# Patient Record
Sex: Male | Born: 1970 | Race: White | Hispanic: No | Smoking: Current every day smoker
Health system: Southern US, Community
[De-identification: ages and names within clinical notes are randomized; demographics above are authoritative.]

## PROBLEM LIST (undated history)

## (undated) DIAGNOSIS — F1910 Other psychoactive substance abuse, uncomplicated: Secondary | ICD-10-CM

## (undated) DIAGNOSIS — J45909 Unspecified asthma, uncomplicated: Secondary | ICD-10-CM

## (undated) DIAGNOSIS — F32A Depression, unspecified: Secondary | ICD-10-CM

## (undated) DIAGNOSIS — F419 Anxiety disorder, unspecified: Secondary | ICD-10-CM

## (undated) DIAGNOSIS — F329 Major depressive disorder, single episode, unspecified: Secondary | ICD-10-CM

## (undated) HISTORY — DX: Depression, unspecified: F32.A

## (undated) HISTORY — DX: Other psychoactive substance abuse, uncomplicated: F19.10

## (undated) HISTORY — DX: Anxiety disorder, unspecified: F41.9

## (undated) HISTORY — DX: Major depressive disorder, single episode, unspecified: F32.9

## (undated) HISTORY — DX: Unspecified asthma, uncomplicated: J45.909

---

## 2002-01-26 ENCOUNTER — Encounter: Payer: Self-pay | Admitting: Emergency Medicine

## 2002-01-26 ENCOUNTER — Emergency Department (HOSPITAL_COMMUNITY): Admission: EM | Admit: 2002-01-26 | Discharge: 2002-01-26 | Payer: Self-pay | Admitting: Emergency Medicine

## 2006-12-28 ENCOUNTER — Emergency Department: Payer: Self-pay

## 2017-06-17 ENCOUNTER — Ambulatory Visit: Payer: Self-pay | Admitting: Urology

## 2017-07-10 ENCOUNTER — Ambulatory Visit (INDEPENDENT_AMBULATORY_CARE_PROVIDER_SITE_OTHER): Payer: Self-pay | Admitting: Urology

## 2017-07-10 ENCOUNTER — Encounter: Payer: Self-pay | Admitting: Urology

## 2017-07-10 VITALS — BP 143/82 | HR 111 | Resp 16 | Ht 66.0 in | Wt 192.0 lb

## 2017-07-10 DIAGNOSIS — R339 Retention of urine, unspecified: Secondary | ICD-10-CM

## 2017-07-10 DIAGNOSIS — N401 Enlarged prostate with lower urinary tract symptoms: Secondary | ICD-10-CM

## 2017-07-10 LAB — MICROSCOPIC EXAMINATION
Bacteria, UA: NONE SEEN
Epithelial Cells (non renal): NONE SEEN /hpf (ref 0–10)
RBC, UA: NONE SEEN /hpf (ref 0–2)
WBC, UA: NONE SEEN /hpf (ref 0–5)

## 2017-07-10 LAB — URINALYSIS, COMPLETE
Bilirubin, UA: NEGATIVE
Glucose, UA: NEGATIVE
Ketones, UA: NEGATIVE
Leukocytes, UA: NEGATIVE
Nitrite, UA: NEGATIVE
Protein, UA: NEGATIVE
RBC, UA: NEGATIVE
Specific Gravity, UA: <1.005 — ABNORMAL LOW (ref 1.005–1.030)
Urobilinogen, Ur: 0.2 mg/dL (ref 0.2–1.0)
pH, UA: 5.5 (ref 5.0–7.5)

## 2017-07-10 LAB — BLADDER SCAN AMB NON-IMAGING: Scan Result: 0

## 2017-07-10 MED ORDER — TAMSULOSIN HCL 0.4 MG PO CAPS: 0.4000 mg | ORAL_CAPSULE | Freq: Every day | ORAL | 2 refills | Status: DC

## 2017-07-10 NOTE — Progress Notes (Signed)
07/10/2017 10:14 AM   Vergie LivingJason K Atteberry 03-Oct-1970 [id number]  Referring provider: No referring provider defined for this encounter.  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  He is on methadone and undergoes periodic urine drug testing and states he has to void in front of a witness which he is unable to do.  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  He does have baseline urinary hesitancy, decreased force and caliber of his urinary stream, straining to urinate and postvoid dribbling.  He has nocturia x1-2.  He saw Dr. Evelene CroonWolff several years ago but states nothing was recommended.  He denies dysuria or gross hematuria.  He denies flank, abdominal, pelvic or scrotal pain.   PMH: Past Medical History:  Diagnosis Date  . Anxiety   . Asthma   . Depression     Surgical History: History reviewed. No pertinent surgical history.  Home Medications:  Allergies as of 07/10/2017   No Known Allergies     Medication List        Accurate as of 07/10/17 10:14 AM. Always use your most recent med list.          diphenhydramine-acetaminophen 25-500 MG Tabs tablet Commonly known as:  TYLENOL PM Take 1 tablet by mouth at bedtime as needed.       Allergies: No Known Allergies  Family History: Family History  Problem Relation Age of Onset  . Prostate cancer Neg Hx   . Bladder Cancer Neg Hx   . Kidney cancer Neg Hx     Social History:  reports that he has been smoking.  He has never used smokeless tobacco. He reports that he has current or past drug history. His alcohol history is not on file.  ROS: UROLOGY Frequent Urination?: No Hard to postpone urination?: No Burning/pain with urination?: No Get up at night to urinate?: Yes Leakage of urine?: Yes Urine stream starts and stops?: Yes Trouble starting stream?: Yes Do  you have to strain to urinate?: Yes Blood in urine?: No Urinary tract infection?: No Sexually transmitted disease?: No Injury to kidneys or bladder?: No Painful intercourse?: No Weak stream?: No Erection problems?: Yes Penile pain?: No  Gastrointestinal Nausea?: No Vomiting?: No Indigestion/heartburn?: No Diarrhea?: No Constipation?: Yes  Constitutional Fever: No Night sweats?: No Weight loss?: No Fatigue?: Yes  Skin Skin rash/lesions?: Yes Itching?: Yes  Eyes Blurred vision?: Yes Double vision?: No  Ears/Nose/Throat Sore throat?: No Sinus problems?: No  Hematologic/Lymphatic Swollen glands?: No Easy bruising?: No  Cardiovascular Leg swelling?: No Chest pain?: No  Respiratory Cough?: Yes Shortness of breath?: Yes  Endocrine Excessive thirst?: No  Musculoskeletal Back pain?: No Joint pain?: No  Neurological Headaches?: Yes Dizziness?: No  Psychologic Depression?: Yes Anxiety?: Yes  Physical Exam: BP (!) 143/82   Pulse (!) 111   Resp 16   Ht 5\' 6"  (1.676 m)   Wt 192 lb (87.1 kg)   SpO2 98%   BMI 30.99 kg/m   Constitutional:  Alert and oriented, No acute distress. HEENT: [place] AT, moist mucus membranes.  Trachea midline, no masses. Cardiovascular: No clubbing, cyanosis, or edema. Respiratory: Normal respiratory effort, no increased work of breathing. GI: Abdomen is soft, nontender, nondistended, no abdominal masses GU: No CVA tenderness.  Prostate 30 g smooth without nodules Lymph: No cervical or  inguinal lymphadenopathy. Skin: No rashes, bruises or suspicious lesions. Neurologic: Grossly intact, no focal deficits, moving all 4 extremities. Psychiatric: Normal mood and affect.   Assessment & Plan:   47 year old male with trouble voiding in front of others.  He does have moderate baseline lower urinary tract symptoms.  PVR by bladder scan today was 0 mL.  He was informed that the diagnosis of shy bladder is a diagnosis of exclusion.  I  recommended scheduling a renal ultrasound.  His lower urinary tract symptoms are bothersome and he did desire a trial of medical management.  Rx tamsulosin was sent to his pharmacy.  He states he has seen a therapist.  If his evaluation is negative I informed him that all I would be able to say is that I see no organic cause of his symptoms and he may have shy bladder.  He is seeing a therapist and would also need input from his therapist regarding this diagnosis.    Riki Altes, MD  [facility] [address] [place], [place] [address] [phone]

## 2017-08-07 ENCOUNTER — Ambulatory Visit: Payer: Self-pay

## 2017-08-22 ENCOUNTER — Ambulatory Visit: Payer: Self-pay

## 2019-08-14 ENCOUNTER — Other Ambulatory Visit: Payer: Self-pay

## 2019-08-14 ENCOUNTER — Emergency Department
Admission: EM | Admit: 2019-08-14 | Discharge: 2019-08-15 | Disposition: A | Discharge status: Home / Self Care | Payer: Self-pay | Attending: Emergency Medicine | Admitting: Emergency Medicine

## 2019-08-14 DIAGNOSIS — R440 Auditory hallucinations: Secondary | ICD-10-CM | POA: Insufficient documentation

## 2019-08-14 DIAGNOSIS — J45909 Unspecified asthma, uncomplicated: Secondary | ICD-10-CM | POA: Insufficient documentation

## 2019-08-14 DIAGNOSIS — F419 Anxiety disorder, unspecified: Secondary | ICD-10-CM | POA: Insufficient documentation

## 2019-08-14 DIAGNOSIS — Z046 Encounter for general psychiatric examination, requested by authority: Secondary | ICD-10-CM | POA: Insufficient documentation

## 2019-08-14 DIAGNOSIS — F17210 Nicotine dependence, cigarettes, uncomplicated: Secondary | ICD-10-CM | POA: Insufficient documentation

## 2019-08-14 DIAGNOSIS — F329 Major depressive disorder, single episode, unspecified: Secondary | ICD-10-CM | POA: Insufficient documentation

## 2019-08-14 DIAGNOSIS — Z79899 Other long term (current) drug therapy: Secondary | ICD-10-CM | POA: Insufficient documentation

## 2019-08-14 DIAGNOSIS — R456 Violent behavior: Secondary | ICD-10-CM | POA: Insufficient documentation

## 2019-08-14 DIAGNOSIS — F1990 Other psychoactive substance use, unspecified, uncomplicated: Secondary | ICD-10-CM | POA: Insufficient documentation

## 2019-08-14 DIAGNOSIS — Z20822 Contact with and (suspected) exposure to covid-19: Secondary | ICD-10-CM | POA: Insufficient documentation

## 2019-08-14 LAB — COMPREHENSIVE METABOLIC PANEL
ALT: 16 U/L (ref 0–44)
AST: 15 U/L (ref 15–41)
Albumin: 4.8 g/dL (ref 3.5–5.0)
Alkaline Phosphatase: 85 U/L (ref 38–126)
Anion gap: 8 (ref 5–15)
BUN: 25 mg/dL — ABNORMAL HIGH (ref 6–20)
CO2: 28 mmol/L (ref 22–32)
Calcium: 9.2 mg/dL (ref 8.9–10.3)
Chloride: 101 mmol/L (ref 98–111)
Creatinine, Ser: 0.9 mg/dL (ref 0.61–1.24)
GFR calc Af Amer: >60 mL/min (ref >60)
GFR calc non Af Amer: >60 mL/min (ref >60)
Glucose, Bld: 101 mg/dL — ABNORMAL HIGH (ref 70–99)
Potassium: 4.1 mmol/L (ref 3.5–5.1)
Sodium: 137 mmol/L (ref 135–145)
Total Bilirubin: 0.6 mg/dL (ref 0.3–1.2)
Total Protein: 7.8 g/dL (ref 6.5–8.1)

## 2019-08-14 LAB — URINE DRUG SCREEN, QUALITATIVE (ARMC ONLY)
Amphetamines, Ur Screen: POSITIVE — AB
Barbiturates, Ur Screen: NOT DETECTED
Benzodiazepine, Ur Scrn: NOT DETECTED
Cannabinoid 50 Ng, Ur ~~LOC~~: NOT DETECTED
Cocaine Metabolite,Ur ~~LOC~~: NOT DETECTED
MDMA (Ecstasy)Ur Screen: NOT DETECTED
Methadone Scn, Ur: POSITIVE — AB
Opiate, Ur Screen: NOT DETECTED
Phencyclidine (PCP) Ur S: NOT DETECTED
Tricyclic, Ur Screen: NOT DETECTED

## 2019-08-14 LAB — CBC
HCT: 38.6 % — ABNORMAL LOW (ref 39.0–52.0)
Hemoglobin: 13.4 g/dL (ref 13.0–17.0)
MCH: 33.1 pg (ref 26.0–34.0)
MCHC: 34.7 g/dL (ref 30.0–36.0)
MCV: 95.3 fL (ref 80.0–100.0)
Platelets: 238 10*3/uL (ref 150–400)
RBC: 4.05 MIL/uL — ABNORMAL LOW (ref 4.22–5.81)
RDW: 12.1 % (ref 11.5–15.5)
WBC: 7.3 10*3/uL (ref 4.0–10.5)
nRBC: 0 % (ref 0.0–0.2)

## 2019-08-14 LAB — ETHANOL: Alcohol, Ethyl (B): <10 mg/dL (ref <10)

## 2019-08-14 LAB — ACETAMINOPHEN LEVEL: Acetaminophen (Tylenol), Serum: <10 ug/mL — ABNORMAL LOW (ref 10–30)

## 2019-08-14 LAB — SALICYLATE LEVEL: Salicylate Lvl: <7 mg/dL — ABNORMAL LOW (ref 7.0–30.0)

## 2019-08-14 NOTE — ED Notes (Addendum)
Pt was given a sandwich tray and a cup of sprite with ice, no straw or lid. Pt ate 100%.

## 2019-08-14 NOTE — ED Notes (Signed)
Patient changed into hospital provided scrubs by this RN and by [facility] NT. Patient's belonging's placed into labeled bag. Patient's belonging's include:  Grey wallet, red polo shirt, blue jeans, dark blue/black belt, red sneakers, red socks, black boxers, yellow-colored metal ring.
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
## 2019-08-14 NOTE — ED Notes (Signed)
Pt talking to TTS att
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
## 2019-08-15 DIAGNOSIS — F1990 Other psychoactive substance use, unspecified, uncomplicated: Secondary | ICD-10-CM

## 2019-08-15 LAB — SARS CORONAVIRUS 2 BY RT PCR (HOSPITAL ORDER, PERFORMED IN ~~LOC~~ HOSPITAL LAB): SARS Coronavirus 2: NEGATIVE
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
## 2019-08-15 NOTE — BH Assessment (Signed)
Writer spoke with the patient to complete an updated/reassessment. Patient denies SI/HI and AV/H.  Spoke with patient's girlfriend (Stacey-[phone]), she reports of having no concerns of him harming himself or anyone else.  Patient does not meet inpatient criteria.

## 2019-08-15 NOTE — ED Notes (Signed)
Pt brought into ED BHU via sally port and wand with metal detector for safety by [place] Security officer. Patient oriented to unit/care area: Pt informed of unit policies and procedures.  Informed that, for their safety, care areas are designed for safety and monitored by security cameras at all times. Patient verbalizes understanding, and verbal contract for safety obtained.Pt shown to their room.  

## 2019-08-15 NOTE — ED Notes (Signed)
Given phone to call family 

## 2019-08-15 NOTE — ED Provider Notes (Signed)
-----------------------------------------   12:18 PM on 08/15/2019 -----------------------------------------  Patient has been seen and evaluated by psychiatry.  They have rescinded the patient's IVC and believe the patient is safe for discharge home from a psychiatric standpoint.  Patient's medical work-up is largely nonrevealing besides positive amphetamine and methadone urine drug screen.  Patient will be discharged home.   Minna Antis, MD 08/15/19 1218

## 2019-08-15 NOTE — Discharge Instructions (Signed)
You have been seen in the emergency department for a  psychiatric concern. You have been evaluated both medically as well as psychiatrically. Please follow-up with your outpatient resources provided. Return to the emergency department for any worsening symptoms, or any thoughts of hurting yourself or anyone else so that we may attempt to help you. 

## 2019-08-15 NOTE — ED Notes (Signed)
Pt to toilet.  

## 2019-08-15 NOTE — ED Notes (Signed)
Pt signed dc paperwork and paperwork sent to records. 

## 2019-10-26 ENCOUNTER — Ambulatory Visit: Payer: Self-pay | Admitting: Physician Assistant

## 2019-10-26 ENCOUNTER — Other Ambulatory Visit: Payer: Self-pay

## 2019-10-26 DIAGNOSIS — A5401 Gonococcal cystitis and urethritis, unspecified: Secondary | ICD-10-CM

## 2019-10-26 DIAGNOSIS — Z113 Encounter for screening for infections with a predominantly sexual mode of transmission: Secondary | ICD-10-CM

## 2019-10-26 MED ORDER — DOXYCYCLINE HYCLATE 100 MG PO TABS: 100.0000 mg | ORAL_TABLET | Freq: Two times a day (BID) | ORAL | 0 refills | Status: DC

## 2019-10-26 MED ORDER — CEFTRIAXONE SODIUM 500 MG IJ SOLR
500.0000 mg | Freq: Once | INTRAMUSCULAR | Status: AC
  Administered 2019-10-26: 500 mg via INTRAMUSCULAR

## 2019-10-26 NOTE — Progress Notes (Signed)
Gram stain reviewed, pt treated for GC per provider orders. Provider orders completed. 

## 2019-10-27 LAB — GRAM STAIN

## 2019-10-28 ENCOUNTER — Encounter: Payer: Self-pay | Admitting: Physician Assistant

## 2019-10-28 NOTE — Progress Notes (Signed)
[facility] Department STI clinic/screening visit  Subjective:  [name] is a 49 y.o. male being seen today for an STI screening visit. The patient reports they do have symptoms.    Patient has the following medical conditions:  There are no problems to display for this patient.    Chief Complaint  Patient presents with  . SEXUALLY TRANSMITTED DISEASE    STD screening including bloodwork    HPI  Patient reports that he has had yellow discharge and dysuria for about 6 days.  Denies other symptoms and would like a screening today.  Reports that his last HIV and Hep C test was done at his methadone clinic about 2 months ago.  Patient reports that he is in counseling for anxiety and depression and that he is taking methadone daily.  Last void prior to collecting sample for Gram stain was about 45 minutes ago.     See flowsheet for further details and programmatic requirements.    The following portions of the patient's history were reviewed and updated as appropriate: allergies, current medications, past medical history, past social history, past surgical history and problem list.  Objective:  There were no vitals filed for this visit.  Physical Exam Constitutional:      General: He is not in acute distress. HENT:     Head: Normocephalic and atraumatic.     Comments: No nits, lice or hair loss. No cervical, supraclavicular or axillary adenopathy.    Mouth/Throat:     Mouth: Mucous membranes are moist.     Pharynx: Oropharynx is clear. No oropharyngeal exudate or posterior oropharyngeal erythema.  Eyes:     Conjunctiva/sclera: Conjunctivae normal.  Pulmonary:     Effort: Pulmonary effort is normal.  Abdominal:     Palpations: Abdomen is soft. There is no mass.     Tenderness: There is no abdominal tenderness. There is no guarding or rebound.  Genitourinary:    Penis: Normal.      Testes: Normal.     Comments: Pubic area without nits, lice, edema,  erythema and lesions. Shotty inguinal adenopathy bilaterally. Penis circumcised, without rash or lesions. Moderate amount of yellow/brownish discharge at meatus. Musculoskeletal:     Cervical back: Neck supple. No tenderness.  Skin:    General: Skin is warm and dry.     Findings: No bruising, erythema, lesion or rash.  Neurological:     Mental Status: He is alert and oriented to person, place, and time.  Psychiatric:        Mood and Affect: Mood normal.        Behavior: Behavior normal.        Thought Content: Thought content normal.        Judgment: Judgment normal.       Assessment and Plan:  [name] is a 49 y.o. male presenting to the [facility] Department for STI screening  1. Screening for STD (sexually transmitted disease) Patient into clinic with symptoms. Rec condoms with all sex. Await test results.  Counseled that RN will call if needs to RTC for treatment once results are back. Please offer patient Narcan kit to use at home. Enc patient to continue follow up with PCP, counselor and methadone clinic per recommendations. - Gram stain - HIV [facility] - Syphilis Serology, [facility]  2. Gonococcal urethritis in male Treat for GC and to cover for Chlamydia with Ceftriaxone 500 mg IM and Doxycycline 100 mg #14  1 po BID for 7 days. No sex for 7 days and until after partner completes treatment. Call with questions or concerns.  - cefTRIAXone (ROCEPHIN) injection 500 mg - doxycycline (VIBRA-TABS) 100 MG tablet; Take 1 tablet (100 mg total) by mouth 2 (two) times daily for 7 days.  Dispense: 14 tablet; Refill: 0     Return in about 3 months (around 01/26/2020) for TOC.  No future appointments.  Jerene Dilling, PA

## 2020-02-24 ENCOUNTER — Other Ambulatory Visit: Payer: Self-pay

## 2020-02-24 ENCOUNTER — Inpatient Hospital Stay (HOSPITAL_COMMUNITY)
Admission: EM | Admit: 2020-02-24 | Discharge: 2020-03-01 | DRG: 394 | Attending: Physician Assistant | Admitting: Physician Assistant

## 2020-02-24 ENCOUNTER — Encounter (HOSPITAL_COMMUNITY): Payer: Self-pay | Admitting: Emergency Medicine

## 2020-02-24 ENCOUNTER — Emergency Department (HOSPITAL_COMMUNITY)

## 2020-02-24 DIAGNOSIS — F419 Anxiety disorder, unspecified: Secondary | ICD-10-CM | POA: Diagnosis present

## 2020-02-24 DIAGNOSIS — L0231 Cutaneous abscess of buttock: Secondary | ICD-10-CM | POA: Diagnosis present

## 2020-02-24 DIAGNOSIS — K615 Supralevator abscess: Secondary | ICD-10-CM | POA: Diagnosis present

## 2020-02-24 DIAGNOSIS — B9562 Methicillin resistant Staphylococcus aureus infection as the cause of diseases classified elsewhere: Secondary | ICD-10-CM | POA: Diagnosis present

## 2020-02-24 DIAGNOSIS — I471 Supraventricular tachycardia: Secondary | ICD-10-CM | POA: Diagnosis present

## 2020-02-24 DIAGNOSIS — F17210 Nicotine dependence, cigarettes, uncomplicated: Secondary | ICD-10-CM | POA: Diagnosis present

## 2020-02-24 DIAGNOSIS — J45909 Unspecified asthma, uncomplicated: Secondary | ICD-10-CM | POA: Diagnosis present

## 2020-02-24 DIAGNOSIS — N400 Enlarged prostate without lower urinary tract symptoms: Secondary | ICD-10-CM | POA: Diagnosis present

## 2020-02-24 DIAGNOSIS — K612 Anorectal abscess: Secondary | ICD-10-CM | POA: Diagnosis not present

## 2020-02-24 DIAGNOSIS — D75839 Thrombocytosis, unspecified: Secondary | ICD-10-CM | POA: Diagnosis present

## 2020-02-24 DIAGNOSIS — K611 Rectal abscess: Secondary | ICD-10-CM | POA: Diagnosis not present

## 2020-02-24 DIAGNOSIS — Z20822 Contact with and (suspected) exposure to covid-19: Secondary | ICD-10-CM | POA: Diagnosis present

## 2020-02-24 DIAGNOSIS — K651 Peritoneal abscess: Secondary | ICD-10-CM | POA: Diagnosis present

## 2020-02-24 DIAGNOSIS — F32A Depression, unspecified: Secondary | ICD-10-CM | POA: Diagnosis present

## 2020-02-24 DIAGNOSIS — K529 Noninfective gastroenteritis and colitis, unspecified: Secondary | ICD-10-CM | POA: Diagnosis present

## 2020-02-24 LAB — CBC WITH DIFFERENTIAL/PLATELET
Abs Immature Granulocytes: 0.03 10*3/uL (ref 0.00–0.07)
Basophils Absolute: 0.1 10*3/uL (ref 0.0–0.1)
Basophils Relative: 1 %
Eosinophils Absolute: 0.1 10*3/uL (ref 0.0–0.5)
Eosinophils Relative: 1 %
HCT: 35.3 % — ABNORMAL LOW (ref 39.0–52.0)
Hemoglobin: 12 g/dL — ABNORMAL LOW (ref 13.0–17.0)
Immature Granulocytes: 0 %
Lymphocytes Relative: 18 %
Lymphs Abs: 1.5 10*3/uL (ref 0.7–4.0)
MCH: 33.1 pg (ref 26.0–34.0)
MCHC: 34 g/dL (ref 30.0–36.0)
MCV: 97.2 fL (ref 80.0–100.0)
Monocytes Absolute: 0.7 10*3/uL (ref 0.1–1.0)
Monocytes Relative: 9 %
Neutro Abs: 5.9 10*3/uL (ref 1.7–7.7)
Neutrophils Relative %: 71 %
Platelets: 674 10*3/uL — ABNORMAL HIGH (ref 150–400)
RBC: 3.63 MIL/uL — ABNORMAL LOW (ref 4.22–5.81)
RDW: 13.4 % (ref 11.5–15.5)
WBC: 8.3 10*3/uL (ref 4.0–10.5)
nRBC: 0 % (ref 0.0–0.2)

## 2020-02-24 LAB — COMPREHENSIVE METABOLIC PANEL
ALT: 40 U/L (ref 0–44)
AST: 38 U/L (ref 15–41)
Albumin: 3.4 g/dL — ABNORMAL LOW (ref 3.5–5.0)
Alkaline Phosphatase: 58 U/L (ref 38–126)
Anion gap: 12 (ref 5–15)
BUN: 22 mg/dL — ABNORMAL HIGH (ref 6–20)
CO2: 25 mmol/L (ref 22–32)
Calcium: 9 mg/dL (ref 8.9–10.3)
Chloride: 100 mmol/L (ref 98–111)
Creatinine, Ser: 0.97 mg/dL (ref 0.61–1.24)
GFR, Estimated: >60 mL/min (ref >60)
Glucose, Bld: 91 mg/dL (ref 70–99)
Potassium: 5.7 mmol/L — ABNORMAL HIGH (ref 3.5–5.1)
Sodium: 137 mmol/L (ref 135–145)
Total Bilirubin: 1.5 mg/dL — ABNORMAL HIGH (ref 0.3–1.2)
Total Protein: 8.3 g/dL — ABNORMAL HIGH (ref 6.5–8.1)

## 2020-02-24 LAB — URINALYSIS, ROUTINE W REFLEX MICROSCOPIC
Bilirubin Urine: NEGATIVE
Glucose, UA: 50 mg/dL — AB
Hgb urine dipstick: NEGATIVE
Ketones, ur: NEGATIVE mg/dL
Leukocytes,Ua: NEGATIVE
Nitrite: NEGATIVE
Protein, ur: NEGATIVE mg/dL
Specific Gravity, Urine: >1.046 — ABNORMAL HIGH (ref 1.005–1.030)
pH: 6 (ref 5.0–8.0)

## 2020-02-24 MED ORDER — ACETAMINOPHEN 650 MG RE SUPP: 650.0000 mg | Freq: Four times a day (QID) | RECTAL | Status: DC | PRN

## 2020-02-24 MED ORDER — ALBUTEROL SULFATE HFA 108 (90 BASE) MCG/ACT IN AERS: 2.0000 | INHALATION_SPRAY | Freq: Four times a day (QID) | RESPIRATORY_TRACT | Status: DC | PRN

## 2020-02-24 MED ORDER — IOHEXOL 300 MG/ML  SOLN
100.0000 mL | Freq: Once | INTRAMUSCULAR | Status: AC | PRN
  Administered 2020-02-24: 100 mL via INTRAVENOUS

## 2020-02-24 MED ORDER — ONDANSETRON HCL 4 MG/2ML IJ SOLN
4.0000 mg | Freq: Four times a day (QID) | INTRAMUSCULAR | Status: DC | PRN
  Administered 2020-02-25: 4 mg via INTRAVENOUS
  Filled 2020-02-24: qty 2

## 2020-02-24 MED ORDER — PIPERACILLIN-TAZOBACTAM 3.375 G IVPB
3.3750 g | Freq: Three times a day (TID) | INTRAVENOUS | Status: DC
  Administered 2020-02-24 – 2020-02-28 (×10): 3.375 g via INTRAVENOUS
  Filled 2020-02-24 (×12): qty 50

## 2020-02-24 MED ORDER — TRAMADOL HCL 50 MG PO TABS
50.0000 mg | ORAL_TABLET | Freq: Four times a day (QID) | ORAL | Status: DC | PRN
  Administered 2020-02-25: 50 mg via ORAL
  Filled 2020-02-24: qty 1

## 2020-02-24 MED ORDER — ONDANSETRON 4 MG PO TBDP: 4.0000 mg | ORAL_TABLET | Freq: Four times a day (QID) | ORAL | Status: DC | PRN

## 2020-02-24 MED ORDER — LACTATED RINGERS IV SOLN: INTRAVENOUS | Status: DC

## 2020-02-24 MED ORDER — ACETAMINOPHEN 325 MG PO TABS
650.0000 mg | ORAL_TABLET | Freq: Four times a day (QID) | ORAL | Status: DC | PRN
  Administered 2020-02-25 (×3): 650 mg via ORAL
  Filled 2020-02-24 (×3): qty 2

## 2020-02-24 NOTE — ED Notes (Signed)
Patient reports pus draining from the abscess.

## 2020-02-24 NOTE — ED Provider Notes (Signed)
[facility]-EMERGENCY DEPT Provider Note   CSN: [id number] Arrival date & time: 02/24/20  1514     History Chief Complaint  Patient presents with  . Abscess    [name] is a 49 y.o. male.  HPI Patient is a 49 year old male with a medical history as noted below.  He presents to the emergency department from prison in custody.  Patient was incarcerated on November 15.  Since his incarceration he began experiencing an abscess on the left medial buttock just lateral to the anus.  It was incised successfully.  Patient was given a 10-day course of clindamycin 150, twice per day.  He was then started on Augmentin for 10 days which he also took twice per day.  He has continued to have consistent copious purulent drainage from the site to the point that he has had to wear adult diapers throughout the day to contain all the drainage.  Reports chronic diarrhea at night but otherwise denies fevers, chills, vomiting.    Past Medical History:  Diagnosis Date  . Anxiety   . Asthma   . Depression     There are no problems to display for this patient.   History reviewed. No pertinent surgical history.     Family History  Problem Relation Age of Onset  . Prostate cancer Neg Hx   . Bladder Cancer Neg Hx   . Kidney cancer Neg Hx     Social History   Tobacco Use  . Smoking status: Current Every Day Smoker    Types: Cigarettes  . Smokeless tobacco: Never Used  Substance Use Topics  . Alcohol use: Not Currently  . Drug use: Yes    Types: Methamphetamines, "Crack" cocaine    Home Medications Prior to Admission medications   Medication Sig Start Date End Date Taking? Authorizing Provider  diphenhydramine-acetaminophen (TYLENOL PM) 25-500 MG TABS tablet Take 1 tablet by mouth at bedtime as needed.    [provider]  tamsulosin (FLOMAX) 0.4 MG CAPS capsule Take 1 capsule (0.4 mg total) by mouth daily. 07/10/17   Stoioff, Verna Czech, MD    Allergies      Patient has no known allergies.  Review of Systems   Review of Systems  All other systems reviewed and are negative. Ten systems reviewed and are negative for acute change, except as noted in the HPI.    Physical Exam Updated Vital Signs BP (!) 128/95 (BP Location: Left Arm)   Pulse (!) 115   Temp 99.5 F (37.5 C) (Oral)   Resp 16   Ht 5\' 9"  (1.753 m)   SpO2 98%   BMI 28.36 kg/m   Physical Exam Vitals and nursing note reviewed.  Constitutional:      General: He is not in acute distress.    Appearance: Normal appearance. He is not ill-appearing, toxic-appearing or diaphoretic.  HENT:     Head: Normocephalic and atraumatic.     Right Ear: External ear normal.     Left Ear: External ear normal.     Nose: Nose normal.     Mouth/Throat:     Mouth: Mucous membranes are moist.     Pharynx: Oropharynx is clear. No oropharyngeal exudate or posterior oropharyngeal erythema.  Eyes:     Extraocular Movements: Extraocular movements intact.  Cardiovascular:     Rate and Rhythm: Regular rhythm. Tachycardia present.     Pulses: Normal pulses.     Heart sounds: Normal heart sounds. No murmur heard.  No friction rub. No gallop.   Pulmonary:     Effort: Pulmonary effort is normal. No respiratory distress.     Breath sounds: Normal breath sounds. No stridor. No wheezing, rhonchi or rales.  Abdominal:     General: Abdomen is flat.     Palpations: Abdomen is soft.     Tenderness: There is abdominal tenderness.     Comments: Abdomen is soft.  Mild diffuse lower abdominal pain with deep palpation.  Genitourinary:    Comments: Chaperone present.  Actively draining abscess noted to the left medial buttock just lateral of the anus.  Green/yellow discharge draining from the site.  Surrounding cellulitis. Musculoskeletal:        General: Normal range of motion.     Cervical back: Normal range of motion and neck supple. No tenderness.  Skin:    General: Skin is warm and dry.  Neurological:      General: No focal deficit present.     Mental Status: He is alert and oriented to person, place, and time.  Psychiatric:        Mood and Affect: Mood normal.        Behavior: Behavior normal.    ED Results / Procedures / Treatments   Labs (all labs ordered are listed, but only abnormal results are displayed) Labs Reviewed  COMPREHENSIVE METABOLIC PANEL - Abnormal; Notable for the following components:      Result Value   Potassium 5.7 (*)    BUN 22 (*)    Total Protein 8.3 (*)    Albumin 3.4 (*)    Total Bilirubin 1.5 (*)    All other components within normal limits  CBC WITH DIFFERENTIAL/PLATELET - Abnormal; Notable for the following components:   RBC 3.63 (*)    Hemoglobin 12.0 (*)    HCT 35.3 (*)    Platelets 674 (*)    All other components within normal limits  RESP PANEL BY RT-PCR (FLU A&B, COVID) ARPGX2  URINALYSIS, ROUTINE W REFLEX MICROSCOPIC  HIV ANTIBODY (ROUTINE TESTING W REFLEX)  PROTIME-INR  BASIC METABOLIC PANEL  CBC   EKG None  Radiology CT ABDOMEN PELVIS W CONTRAST  Result Date: 02/24/2020 CLINICAL DATA:  Abdominal pain. Anal rectal abscess refractory to 20 days of antibiotics. EXAM: CT ABDOMEN AND PELVIS WITH CONTRAST TECHNIQUE: Multidetector CT imaging of the abdomen and pelvis was performed using the standard protocol following bolus administration of intravenous contrast. CONTRAST:  OMNIPAQUE IOHEXOL 300 MG/ML  SOLN COMPARISON:  None. FINDINGS: Lower chest: Perw clear lung bases.  Heart normal in size. Hepatobiliary: No focal liver abnormality is seen. No gallstones, gallbladder wall thickening, or biliary dilatation. Pancreas: Unremarkable. No pancreatic ductal dilatation or surrounding inflammatory changes. Spleen: Normal in size without focal abnormality. Adrenals/Urinary Tract: Adrenal glands are unremarkable. Kidneys are normal, without renal calculi, focal lesion, or hydronephrosis. Bladder is unremarkable. Stomach/Bowel: Normal stomach and  small bowel. Moderate increased stool mildly distends the right and transverse colon. No colonic wall thickening. Left perineal inflammation with a small abscess. No other evidence of inflammation. Normal appendix visualized. Vascular/Lymphatic: No significant vascular findings are present. No enlarged abdominal or pelvic lymph nodes. Reproductive: Prostate is unremarkable. Other: There are extraperitoneal abscesses in the pelvis and low abdomen. Extending from the left perianal/perirectal abscess, there is a larger left sided pelvic abscess measuring 12 cm from anterior to posterior, approximately 4.3 cm right to left. This abscess is contiguous with abscesses that extend along the deep margin of the anterior abdominal  wall fascia, superficial to the peritoneum. On the left this extends superiorly to the level of the left kidney, at least 23 cm in length. This extends on the right from the right anterior inferior pelvis to the level of the inferior margin of the liver. The fluid collections connect across the anterior midline, anterior/superior to the bladder. Musculoskeletal: No fracture or acute finding.  No bone lesion. IMPRESSION: 1. Left perirectal/perianal abscess with associated inflammation connects to a larger extraperitoneal abscesses in the pelvis that extend along the deep margin of the anterior abdominal wall, superficial to the peritoneum. Largest portion of the contiguous collections is in the left pelvis adjacent to the bladder. 2. No other acute abnormality within the abdomen or pelvis. 3. Moderate increased colonic stool burden with mild distension of the right and transverse colon. Electronically Signed   By: Amie Portlandavid  Ormond M.D.   On: 02/24/2020 20:11    Procedures Procedures (including critical care time)  Medications Ordered in ED Medications  lactated ringers infusion (has no administration in time range)  piperacillin-tazobactam (ZOSYN) IVPB 3.375 g (has no administration in time  range)  acetaminophen (TYLENOL) tablet 650 mg (has no administration in time range)    Or  acetaminophen (TYLENOL) suppository 650 mg (has no administration in time range)  traMADol (ULTRAM) tablet 50 mg (has no administration in time range)  ondansetron (ZOFRAN-ODT) disintegrating tablet 4 mg (has no administration in time range)    Or  ondansetron (ZOFRAN) injection 4 mg (has no administration in time range)  albuterol (VENTOLIN HFA) 108 (90 Base) MCG/ACT inhaler 2 puff (has no administration in time range)  iohexol (OMNIPAQUE) 300 MG/ML solution 100 mL (100 mLs Intravenous Contrast Given 02/24/20 1953)    ED Course  I have reviewed the triage vital signs and the nursing notes.  Pertinent labs & imaging results that were available during my care of the patient were reviewed by me and considered in my medical decision making (see chart for details).  Clinical Course as of Feb 23 2218  Wed Feb 24, 2020  2029 IMPRESSION: 1. Left perirectal/perianal abscess with associated inflammation connects to a larger extraperitoneal abscesses in the pelvis that extend along the deep margin of the anterior abdominal wall, superficial to the peritoneum. Largest portion of the contiguous collections is in the left pelvis adjacent to the bladder. 2. No other acute abnormality within the abdomen or pelvis. 3. Moderate increased colonic stool burden with mild distension of the right and transverse colon.  CT ABDOMEN PELVIS W CONTRAST [LJ]  2109 Patient discussed with general surgery.  They will review the CT scan and evaluate the patient.   [LJ]    Clinical Course User Index [LJ] Jannet MantisJoldersma, Yasmine Kilbourne, PA-C   MDM Rules/Calculators/A&P                          Patient is a 49 year old male who presents to the emergency department with what appears to be a perirectal abscess.  Symptoms started a couple of weeks ago.  Patient has completed a course of Augmentin as well as clindamycin and had an I&D performed  while in custody in prison.  He has continued to have purulent discharge from the site that is so significant that he has had to start wearing adult diapers to contain the fluid.  I obtained a CT scan of the abdomen and pelvis showing a perirectal abscess with associated inflammation which connects to a larger extraperitoneal abscess in the  pelvis that extends along the deep margin of the anterior abdominal wall, superficial to the peritoneum.  Largest portion of the contagious collections is in the left pelvis adjacent to the bladder.  CBC with a hemoglobin of 12.  Thrombocytosis of 674.  No leukocytosis or neutrophilia.  CMP with a potassium of 5.7.  Patient discussed with Dr. Freida Busman with general surgery.  She reviewed the CT scan and will have the patient admitted at this time for likely surgery tomorrow.   Final Clinical Impression(s) / ED Diagnoses Final diagnoses:  Perirectal abscess    Rx / DC Orders ED Discharge Orders    None       Placido Sou, PA-C 02/24/20 2224    Gwyneth Sprout, MD 02/28/20 [phone]

## 2020-02-24 NOTE — ED Triage Notes (Signed)
Patient present with an abscess on the left side of his buttock since 11/15. He reports 5/10 pain that greatly increases with sitting. Patient reports draining and taking 2 rounds of antibiotics.

## 2020-02-24 NOTE — ED Notes (Signed)
Spoke with CN Hollie re:  Airborne Precautions.  Precautions will need to be addressed, and preferably his Covid swab resulted before he is admitted to our department.  Thank you.

## 2020-02-24 NOTE — H&P (Signed)
[name] November 07, 1970  [id number].    Chief Complaint/Reason for Consult: perianal abscess  HPI:  [name] is a 49 yo male who presented to the ED with persistent perianal drainage. He says that he first developed an abscess about 2 weeks ago. He is currently incarcerated and an I&D was performed in prison. He also completed 2 courses of oral antibiotics, however has continued to have purulent drainage from the area. He says the drainage is daily and constant. He also endorses some abdominal pain. CT scan showed a perianal abscess with extension along the rectum up into the pelvis and along the abdominal wall, external to the peritoneum. General surgery was consulted for evaluation.  Patient denies a history of prior perirectal abscesses. He has never had a colonoscopy and has no personal or family history of IBD. He reports he had a cousin with colon cancer.  ROS: Review of Systems  Constitutional: Negative for chills and fever.  Respiratory:       H/o asthma  Cardiovascular: Negative for chest pain.  Gastrointestinal: Positive for abdominal pain and diarrhea.  Neurological: Negative for focal weakness and seizures.    Family History  Problem Relation Age of Onset  . Prostate cancer Neg Hx   . Bladder Cancer Neg Hx   . Kidney cancer Neg Hx     Past Medical History:  Diagnosis Date  . Anxiety   . Asthma   . Depression     History reviewed. No pertinent surgical history.  Social History:  reports that he has been smoking cigarettes. He has never used smokeless tobacco. He reports previous alcohol use. He reports current drug use. Drugs: Methamphetamines and "Crack" cocaine.  Allergies: No Known Allergies  (Not in a hospital admission)    Physical Exam: Blood pressure (!) 143/74, pulse (!) 108, temperature 99.5 F (37.5 C), temperature source Oral, resp. rate 16, height 5\' 9"  (1.753 m), SpO2 99 %. General: resting comfortably, appears stated age, no apparent  distress Neurological: alert and oriented, no focal deficits, cranial nerves grossly in tact HEENT: normocephalic, atraumatic, oropharynx clear, no scleral icterus CV: regular rate and rhythm, extremities warm and well-perfused Respiratory: normal work of breathing, symmetric chest wall expansion Abdomen: soft, nondistended, mildly tender along left side and lower abdomen. No masses or organomegaly. No wounds. Extremities: warm and well-perfused, no deformities, moving all extremities spontaneously Psychiatric: normal mood and affect Rectal: purulent drainage from a wound left lateral to the anal verge.   Results for orders placed or performed during the hospital encounter of 02/24/20 (from the past 48 hour(s))  Comprehensive metabolic panel     Status: Abnormal   Collection Time: 02/24/20  6:51 PM  Result Value Ref Range   Sodium 137 135 - 145 mmol/L   Potassium 5.7 (H) 3.5 - 5.1 mmol/L    Comment: MODERATE HEMOLYSIS   Chloride 100 98 - 111 mmol/L   CO2 25 22 - 32 mmol/L   Glucose, Bld 91 70 - 99 mg/dL    Comment: Glucose reference range applies only to samples taken after fasting for at least 8 hours.   BUN 22 (H) 6 - 20 mg/dL   Creatinine, Ser 14/01/21 0.61 - 1.24 mg/dL   Calcium 9.0 8.9 - 2.37 mg/dL   Total Protein 8.3 (H) 6.5 - 8.1 g/dL   Albumin 3.4 (L) 3.5 - 5.0 g/dL   AST 38 15 - 41 U/L   ALT 40 0 - 44 U/L   Alkaline  Phosphatase 58 38 - 126 U/L   Total Bilirubin 1.5 (H) 0.3 - 1.2 mg/dL   GFR, Estimated >62 >13 mL/min    Comment: (NOTE) Calculated using the CKD-EPI Creatinine Equation (2021)    Anion gap 12 5 - 15    Comment: Performed at [facility], [address] [place], [place] [address]  CBC with Differential     Status: Abnormal   Collection Time: 02/24/20  6:51 PM  Result Value Ref Range   WBC 8.3 4.0 - 10.5 K/uL   RBC 3.63 (L) 4.22 - 5.81 MIL/uL   Hemoglobin 12.0 (L) 13.0 - 17.0 g/dL   HCT 84.6 (L) 39 - 52 %   MCV 97.2 80.0 - 100.0 fL   MCH  33.1 26.0 - 34.0 pg   MCHC 34.0 30.0 - 36.0 g/dL   RDW 96.2 95.2 - 84.1 %   Platelets 674 (H) 150 - 400 K/uL   nRBC 0.0 0.0 - 0.2 %   Neutrophils Relative % 71 %   Neutro Abs 5.9 1.7 - 7.7 K/uL   Lymphocytes Relative 18 %   Lymphs Abs 1.5 0.7 - 4.0 K/uL   Monocytes Relative 9 %   Monocytes Absolute 0.7 0.1 - 1.0 K/uL   Eosinophils Relative 1 %   Eosinophils Absolute 0.1 0.0 - 0.5 K/uL   Basophils Relative 1 %   Basophils Absolute 0.1 0.0 - 0.1 K/uL   Immature Granulocytes 0 %   Abs Immature Granulocytes 0.03 0.00 - 0.07 K/uL    Comment: Performed at [facility], [address], [place], [place] [address]   CT ABDOMEN PELVIS W CONTRAST  Result Date: 02/24/2020 CLINICAL DATA:  Abdominal pain. Anal rectal abscess refractory to 20 days of antibiotics. EXAM: CT ABDOMEN AND PELVIS WITH CONTRAST TECHNIQUE: Multidetector CT imaging of the abdomen and pelvis was performed using the standard protocol following bolus administration of intravenous contrast. CONTRAST:  OMNIPAQUE IOHEXOL 300 MG/ML  SOLN COMPARISON:  None. FINDINGS: Lower chest: Perw clear lung bases.  Heart normal in size. Hepatobiliary: No focal liver abnormality is seen. No gallstones, gallbladder wall thickening, or biliary dilatation. Pancreas: Unremarkable. No pancreatic ductal dilatation or surrounding inflammatory changes. Spleen: Normal in size without focal abnormality. Adrenals/Urinary Tract: Adrenal glands are unremarkable. Kidneys are normal, without renal calculi, focal lesion, or hydronephrosis. Bladder is unremarkable. Stomach/Bowel: Normal stomach and small bowel. Moderate increased stool mildly distends the right and transverse colon. No colonic wall thickening. Left perineal inflammation with a small abscess. No other evidence of inflammation. Normal appendix visualized. Vascular/Lymphatic: No significant vascular findings are present. No enlarged abdominal or pelvic lymph nodes. Reproductive:  Prostate is unremarkable. Other: There are extraperitoneal abscesses in the pelvis and low abdomen. Extending from the left perianal/perirectal abscess, there is a larger left sided pelvic abscess measuring 12 cm from anterior to posterior, approximately 4.3 cm right to left. This abscess is contiguous with abscesses that extend along the deep margin of the anterior abdominal wall fascia, superficial to the peritoneum. On the left this extends superiorly to the level of the left kidney, at least 23 cm in length. This extends on the right from the right anterior inferior pelvis to the level of the inferior margin of the liver. The fluid collections connect across the anterior midline, anterior/superior to the bladder. Musculoskeletal: No fracture or acute finding.  No bone lesion. IMPRESSION: 1. Left perirectal/perianal abscess with associated inflammation connects to a larger extraperitoneal abscesses in the pelvis that  extend along the deep margin of the anterior abdominal wall, superficial to the peritoneum. Largest portion of the contiguous collections is in the left pelvis adjacent to the bladder. 2. No other acute abnormality within the abdomen or pelvis. 3. Moderate increased colonic stool burden with mild distension of the right and transverse colon. Electronically Signed   By: Amie Portland M.D.   On: 02/24/2020 20:11      Assessment/Plan 49 yo male with a perirectal abscess with extension into the extraperitoneal pelvis and abdominal wall. The current drainage he is having is not adequate, and he will require a further drainage procedure, likely with a percutaneous approach. - Regular diet, NPO midnight - Repeat labs with coags in am - IV antibiotics - Local wound care - FEN: SCDs, chemical DVT ppx on hold for possible procedure in am - Dispo: admit to observation  Sophronia Simas, MD [facility] Surgery General, Hepatobiliary and Pancreatic Surgery 02/24/20 10:16 PM

## 2020-02-24 NOTE — ED Notes (Signed)
Pt. Documented in error see above note in chart. 

## 2020-02-24 NOTE — ED Notes (Signed)
Pt. doucmented in error see above note in chart.

## 2020-02-25 ENCOUNTER — Observation Stay (HOSPITAL_COMMUNITY)

## 2020-02-25 DIAGNOSIS — F32A Depression, unspecified: Secondary | ICD-10-CM | POA: Diagnosis present

## 2020-02-25 DIAGNOSIS — J45909 Unspecified asthma, uncomplicated: Secondary | ICD-10-CM | POA: Diagnosis present

## 2020-02-25 DIAGNOSIS — K612 Anorectal abscess: Secondary | ICD-10-CM | POA: Diagnosis present

## 2020-02-25 DIAGNOSIS — K611 Rectal abscess: Secondary | ICD-10-CM | POA: Diagnosis present

## 2020-02-25 DIAGNOSIS — I471 Supraventricular tachycardia: Secondary | ICD-10-CM | POA: Diagnosis present

## 2020-02-25 DIAGNOSIS — Z20822 Contact with and (suspected) exposure to covid-19: Secondary | ICD-10-CM | POA: Diagnosis present

## 2020-02-25 DIAGNOSIS — N400 Enlarged prostate without lower urinary tract symptoms: Secondary | ICD-10-CM | POA: Diagnosis present

## 2020-02-25 DIAGNOSIS — K615 Supralevator abscess: Secondary | ICD-10-CM | POA: Diagnosis present

## 2020-02-25 DIAGNOSIS — K529 Noninfective gastroenteritis and colitis, unspecified: Secondary | ICD-10-CM | POA: Diagnosis present

## 2020-02-25 DIAGNOSIS — B9562 Methicillin resistant Staphylococcus aureus infection as the cause of diseases classified elsewhere: Secondary | ICD-10-CM | POA: Diagnosis present

## 2020-02-25 DIAGNOSIS — F17210 Nicotine dependence, cigarettes, uncomplicated: Secondary | ICD-10-CM | POA: Diagnosis present

## 2020-02-25 DIAGNOSIS — F419 Anxiety disorder, unspecified: Secondary | ICD-10-CM | POA: Diagnosis present

## 2020-02-25 DIAGNOSIS — D75839 Thrombocytosis, unspecified: Secondary | ICD-10-CM | POA: Diagnosis present

## 2020-02-25 DIAGNOSIS — L0231 Cutaneous abscess of buttock: Secondary | ICD-10-CM | POA: Diagnosis present

## 2020-02-25 LAB — PROTIME-INR
INR: 1.1 (ref 0.8–1.2)
Prothrombin Time: 14 seconds (ref 11.4–15.2)

## 2020-02-25 LAB — CBC
HCT: 29.5 % — ABNORMAL LOW (ref 39.0–52.0)
Hemoglobin: 9.7 g/dL — ABNORMAL LOW (ref 13.0–17.0)
MCH: 32.2 pg (ref 26.0–34.0)
MCHC: 32.9 g/dL (ref 30.0–36.0)
MCV: 98 fL (ref 80.0–100.0)
Platelets: 478 10*3/uL — ABNORMAL HIGH (ref 150–400)
RBC: 3.01 MIL/uL — ABNORMAL LOW (ref 4.22–5.81)
RDW: 13.5 % (ref 11.5–15.5)
WBC: 7.3 10*3/uL (ref 4.0–10.5)
nRBC: 0 % (ref 0.0–0.2)

## 2020-02-25 LAB — RESP PANEL BY RT-PCR (FLU A&B, COVID) ARPGX2
Influenza A by PCR: NEGATIVE
Influenza B by PCR: NEGATIVE
SARS Coronavirus 2 by RT PCR: NEGATIVE

## 2020-02-25 LAB — BASIC METABOLIC PANEL
Anion gap: 7 (ref 5–15)
BUN: 22 mg/dL — ABNORMAL HIGH (ref 6–20)
CO2: 28 mmol/L (ref 22–32)
Calcium: 8.5 mg/dL — ABNORMAL LOW (ref 8.9–10.3)
Chloride: 102 mmol/L (ref 98–111)
Creatinine, Ser: 0.95 mg/dL (ref 0.61–1.24)
GFR, Estimated: >60 mL/min (ref >60)
Glucose, Bld: 124 mg/dL — ABNORMAL HIGH (ref 70–99)
Potassium: 4.1 mmol/L (ref 3.5–5.1)
Sodium: 137 mmol/L (ref 135–145)

## 2020-02-25 LAB — MRSA PCR SCREENING: MRSA by PCR: NEGATIVE

## 2020-02-25 LAB — HIV ANTIBODY (ROUTINE TESTING W REFLEX): HIV Screen 4th Generation wRfx: NONREACTIVE

## 2020-02-25 MED ORDER — MIDAZOLAM HCL 2 MG/2ML IJ SOLN
INTRAMUSCULAR | Status: AC
  Filled 2020-02-25: qty 4

## 2020-02-25 MED ORDER — FLUMAZENIL 0.5 MG/5ML IV SOLN
INTRAVENOUS | Status: AC
  Filled 2020-02-25: qty 5

## 2020-02-25 MED ORDER — SODIUM CHLORIDE 0.9% FLUSH
5.0000 mL | Freq: Three times a day (TID) | INTRAVENOUS | Status: DC
  Administered 2020-02-25 – 2020-03-01 (×15): 5 mL

## 2020-02-25 MED ORDER — NALOXONE HCL 0.4 MG/ML IJ SOLN
INTRAMUSCULAR | Status: AC
  Filled 2020-02-25: qty 1

## 2020-02-25 MED ORDER — MIDAZOLAM HCL 2 MG/2ML IJ SOLN
INTRAMUSCULAR | Status: AC | PRN
  Administered 2020-02-25 (×2): 1 mg via INTRAVENOUS

## 2020-02-25 MED ORDER — FENTANYL CITRATE (PF) 100 MCG/2ML IJ SOLN
INTRAMUSCULAR | Status: AC
  Filled 2020-02-25: qty 4

## 2020-02-25 MED ORDER — FENTANYL CITRATE (PF) 100 MCG/2ML IJ SOLN
INTRAMUSCULAR | Status: AC | PRN
Start: 2020-02-25 — End: 2020-02-25
  Administered 2020-02-25 (×2): 50 ug via INTRAVENOUS

## 2020-02-25 NOTE — Progress Notes (Signed)
    CC: Perirectal abscess  Subjective: Patient is lying in bed seems fairly comfortable.  I examined him and he has swelling and a site in his right buttocks/perirectal area that is draining purulent fluid.  He has been seen by IR and they plan on placing a drain later this a.m. also.  Objective: Vital signs in last 24 hours: Temp:  [98.1 F (36.7 C)-99.5 F (37.5 C)] 98.3 F (36.8 C) (12/02 0932) Pulse Rate:  [84-119] 84 (12/02 0932) Resp:  [16-17] 17 (12/02 0932) BP: (127-147)/(69-122) 127/72 (12/02 0932) SpO2:  [98 %-100 %] 100 % (12/02 0932) Weight:  [66.1 kg] 66.1 kg (12/02 0048) Last BM Date: 02/24/20  Intake/Output from previous day: 12/01 0701 - 12/02 0700 In: 257.9 [P.O.:30; I.V.:189.9; IV Piggyback:38] Out: -  Intake/Output this shift: Total I/O In: 43.2 [I.V.:43.2] Out: 350 [Urine:350]  General appearance: alert, cooperative and no distress Skin: Skin color, texture, turgor normal. No rashes or lesions or 2 spots on his right buttocks, perirectal area 1 is draining copious amounts of purulent fluid after palpation.  Respiratory: Clear to auscultation Abdomen: Soft nontender, no prior surgical sites, no puncture sites noted.  Lab Results:  Recent Labs    02/24/20 1851 02/25/20 0338  WBC 8.3 7.3  HGB 12.0* 9.7*  HCT 35.3* 29.5*  PLT 674* 478*    BMET Recent Labs    02/24/20 1851 02/25/20 0338  NA 137 137  K 5.7* 4.1  CL 100 102  CO2 25 28  GLUCOSE 91 124*  BUN 22* 22*  CREATININE 0.97 0.95  CALCIUM 9.0 8.5*   PT/INR Recent Labs    02/25/20 0338  LABPROT 14.0  INR 1.1    Recent Labs  Lab 02/24/20 1851  AST 38  ALT 40  ALKPHOS 58  BILITOT 1.5*  PROT 8.3*  ALBUMIN 3.4*     Lipase  No results found for: LIPASE   Prior to Admission medications   Medication Sig Start Date End Date Taking? Authorizing Provider  acetaminophen (TYLENOL) 500 MG tablet Take 500-1,000 mg by mouth every 6 (six) hours as needed for mild pain.   Yes  [provider]  loperamide (IMODIUM A-D) 2 MG tablet Take 2 mg by mouth 4 (four) times daily as needed for diarrhea or loose stools.   Yes [provider]  tamsulosin (FLOMAX) 0.4 MG CAPS capsule Take 1 capsule (0.4 mg total) by mouth daily. Patient not taking: Reported on 02/24/2020 07/10/17   Riki Altes, MD   . lactated ringers 75 mL/hr at 02/25/20 0918  . piperacillin-tazobactam (ZOSYN)  IV 3.375 g (02/24/20 2245)    Medications:   Assessment/Plan BPH  Perianal drainage/abscess I&D in prison ~ 2 weeks ago/ 2 courses of oral antibiotics  FEN:  IV fluids/NPO ID:  Zosyn 12/1>> day 2 SVT:  SCD/ Lovenox after IR has completed their procedure.  Plan:  IR drain placement today, IV antibiotics, local wound care to the buttocks.       LOS: 0 days    Ricki Clack 02/25/2020 Please see Amion

## 2020-02-25 NOTE — Progress Notes (Signed)
Handbook given to patient.  

## 2020-02-25 NOTE — Consult Note (Signed)
Chief Complaint: Patient was seen in consultation today for CT guided drainage of left perirectal/perianal abscess Chief Complaint  Patient presents with  . Abscess    Referring Physician(s): Allen,S  Supervising Physician: Malachy Moan  Patient Status: [facility] - In-pt  History of Present Illness: [name] is a 49 y.o. male smoker with history of substance abuse, anxiety/depression, asthma is currently incarcerated.  He presented to [facility], ED on 12/1 with abdominal pain and persistent perianal drainage.  Patient states he developed abscess in perianal region about 2 weeks ago.  He underwent I&D in prison.  He completed 2 courses of oral antibiotics without significant improvement. He has also had some intermittent fevers.  CT of the abdomen pelvis performed yesterday revealed: 1. Left perirectal/perianal abscess with associated inflammation connects to a larger extraperitoneal abscesses in the pelvis that extend along the deep margin of the anterior abdominal wall, superficial to the peritoneum. Largest portion of the contiguous collections is in the left pelvis adjacent to the bladder. 2. No other acute abnormality within the abdomen or pelvis. 3. Moderate increased colonic stool burden with mild distension of the right and transverse colon.  Latest temp 99.2, WBC 7.3, hemoglobin 9.7, platelets 478k, creatinine 0.95, PT/INR normal, COVID-19 negative.  Request now received from surgery for image guided drainage of the left perirectal/perianal abscess.  Past Medical History:  Diagnosis Date  . Anxiety   . Asthma   . Depression     History reviewed. No pertinent surgical history.  Allergies: Patient has no known allergies.  Medications: Prior to Admission medications   Medication Sig Start Date End Date Taking? Authorizing Provider  acetaminophen (TYLENOL) 500 MG tablet Take 500-1,000 mg by mouth every 6 (six) hours as needed for mild pain.   Yes [provider]  loperamide (IMODIUM A-D) 2 MG tablet Take 2 mg by mouth 4 (four) times daily as needed for diarrhea or loose stools.   Yes [provider]  tamsulosin (FLOMAX) 0.4 MG CAPS capsule Take 1 capsule (0.4 mg total) by mouth daily. Patient not taking: Reported on 02/24/2020 07/10/17   Riki Altes, MD     Family History  Problem Relation Age of Onset  . Prostate cancer Neg Hx   . Bladder Cancer Neg Hx   . Kidney cancer Neg Hx     Social History   Socioeconomic History  . Marital status: Divorced    Spouse name: Not on file  . Number of children: Not on file  . Years of education: Not on file  . Highest education level: Not on file  Occupational History  . Not on file  Tobacco Use  . Smoking status: Current Every Day Smoker    Types: Cigarettes  . Smokeless tobacco: Never Used  Substance and Sexual Activity  . Alcohol use: Not Currently  . Drug use: Yes    Types: Methamphetamines, "Crack" cocaine  . Sexual activity: Yes    Partners: Female  Other Topics Concern  . Not on file  Social History Narrative  . Not on file   Social Determinants of Health   Financial Resource Strain:   . Difficulty of Paying Living Expenses: Not on file  Food Insecurity:   . Worried About Programme researcher, broadcasting/film/video in the Last Year: Not on file  . Ran Out of Food in the Last Year: Not on file  Transportation Needs:   . Lack of Transportation (Medical): Not on file  . Lack of Transportation (  Non-Medical): Not on file  Physical Activity:   . Days of Exercise per Week: Not on file  . Minutes of Exercise per Session: Not on file  Stress:   . Feeling of Stress : Not on file  Social Connections:   . Frequency of Communication with Friends and Family: Not on file  . Frequency of Social Gatherings with Friends and Family: Not on file  . Attends Religious Services: Not on file  . Active Member of Clubs or Organizations: Not on file  . Attends Banker Meetings: Not on  file  . Marital Status: Not on file      Review of Systems see above; denies chest pain, dyspnea, cough, nausea, vomiting or bleeding.  Does have occasional headaches.  Vital Signs: BP (!) 127/96 (BP Location: Right Arm)   Pulse 85   Temp 99.2 F (37.3 C) (Oral)   Resp 16   Ht 5\' 9"  (1.753 m)   Wt 145 lb 11.2 oz (66.1 kg)   SpO2 98%   BMI 21.52 kg/m   Physical Exam awake, alert.  Chest clear to auscultation bilaterally.  Heart with regular rate and rhythm.  Abdomen soft, positive bowel sounds, some mild lower abdominal tenderness to palpation, greater on the left.  No lower extremity edema  Imaging: CT ABDOMEN PELVIS W CONTRAST  Result Date: 02/24/2020 CLINICAL DATA:  Abdominal pain. Anal rectal abscess refractory to 20 days of antibiotics. EXAM: CT ABDOMEN AND PELVIS WITH CONTRAST TECHNIQUE: Multidetector CT imaging of the abdomen and pelvis was performed using the standard protocol following bolus administration of intravenous contrast. CONTRAST:  14/03/2019 OMNIPAQUE IOHEXOL 300 MG/ML  SOLN COMPARISON:  None. FINDINGS: Lower chest: Perw clear lung bases.  Heart normal in size. Hepatobiliary: No focal liver abnormality is seen. No gallstones, gallbladder wall thickening, or biliary dilatation. Pancreas: Unremarkable. No pancreatic ductal dilatation or surrounding inflammatory changes. Spleen: Normal in size without focal abnormality. Adrenals/Urinary Tract: Adrenal glands are unremarkable. Kidneys are normal, without renal calculi, focal lesion, or hydronephrosis. Bladder is unremarkable. Stomach/Bowel: Normal stomach and small bowel. Moderate increased stool mildly distends the right and transverse colon. No colonic wall thickening. Left perineal inflammation with a small abscess. No other evidence of inflammation. Normal appendix visualized. Vascular/Lymphatic: No significant vascular findings are present. No enlarged abdominal or pelvic lymph nodes. Reproductive: Prostate is unremarkable.  Other: There are extraperitoneal abscesses in the pelvis and low abdomen. Extending from the left perianal/perirectal abscess, there is a larger left sided pelvic abscess measuring 12 cm from anterior to posterior, approximately 4.3 cm right to left. This abscess is contiguous with abscesses that extend along the deep margin of the anterior abdominal wall fascia, superficial to the peritoneum. On the left this extends superiorly to the level of the left kidney, at least 23 cm in length. This extends on the right from the right anterior inferior pelvis to the level of the inferior margin of the liver. The fluid collections connect across the anterior midline, anterior/superior to the bladder. Musculoskeletal: No fracture or acute finding.  No bone lesion. IMPRESSION: 1. Left perirectal/perianal abscess with associated inflammation connects to a larger extraperitoneal abscesses in the pelvis that extend along the deep margin of the anterior abdominal wall, superficial to the peritoneum. Largest portion of the contiguous collections is in the left pelvis adjacent to the bladder. 2. No other acute abnormality within the abdomen or pelvis. 3. Moderate increased colonic stool burden with mild distension of the right and transverse colon. Electronically Signed  By: Amie Portland M.D.   On: 02/24/2020 20:11    Labs:  CBC: Recent Labs    08/14/19 2021 02/24/20 1851 02/25/20 0338  WBC 7.3 8.3 7.3  HGB 13.4 12.0* 9.7*  HCT 38.6* 35.3* 29.5*  PLT 238 674* 478*    COAGS: Recent Labs    02/25/20 0338  INR 1.1    BMP: Recent Labs    08/14/19 2021 02/24/20 1851 02/25/20 0338  NA 137 137 137  K 4.1 5.7* 4.1  CL 101 100 102  CO2 28 25 28   GLUCOSE 101* 91 124*  BUN 25* 22* 22*  CALCIUM 9.2 9.0 8.5*  CREATININE 0.90 0.97 0.95  GFRNONAA >60 >60 >60  GFRAA >60  --   --     LIVER FUNCTION TESTS: Recent Labs    08/14/19 2021 02/24/20 1851  BILITOT 0.6 1.5*  AST 15 38  ALT 16 40  ALKPHOS 85  58  PROT 7.8 8.3*  ALBUMIN 4.8 3.4*    TUMOR MARKERS: No results for input(s): AFPTM, CEA, CA199, CHROMGRNA in the last 8760 hours.  Assessment and Plan: 49 y.o. male smoker with history of substance abuse, anxiety/depression, asthma is currently incarcerated.  He presented to [facility], ED on 12/1 with abdominal pain and persistent perianal drainage.  Patient states he developed abscess in perianal region about 2 weeks ago.  He underwent I&D in prison.  He completed 2 courses of oral antibiotics without significant improvement. He has also had some intermittent fevers.  CT of the abdomen pelvis performed yesterday revealed: 1. Left perirectal/perianal abscess with associated inflammation connects to a larger extraperitoneal abscesses in the pelvis that extend along the deep margin of the anterior abdominal wall, superficial to the peritoneum. Largest portion of the contiguous collections is in the left pelvis adjacent to the bladder. 2. No other acute abnormality within the abdomen or pelvis. 3. Moderate increased colonic stool burden with mild distension of the right and transverse colon.  Latest temp 99.2, WBC 7.3, hemoglobin 9.7, platelets 478k, creatinine 0.95, PT/INR normal, COVID-19 negative.  Request now received from surgery for image guided drainage of the left perirectal/perianal abscess.  Imaging studies were reviewed by Dr. 14/1.Risks and benefits discussed with the patient including bleeding, infection, damage to adjacent structures, bowel perforation/fistula connection, and sepsis.  All of the patient's questions were answered, patient is agreeable to proceed. Consent signed and in chart.  Procedure scheduled for this afternoon.   Thank you for this interesting consult.  I greatly enjoyed meeting DERAN BARRO and look forward to participating in their care.  A copy of this report was sent to the requesting provider on this date.  Electronically Signed: D. Vergie Living, PA-C 02/25/2020, 9:21 AM   I spent a total of  30 minutes  in face to face in clinical consultation, greater than 50% of which was counseling/coordinating care for CT-guided drainage of left perirectal/perianal abscess

## 2020-02-25 NOTE — Progress Notes (Signed)
Initial Nutrition Assessment  DOCUMENTATION CODES:   Not applicable  INTERVENTION:  - diet advancement as medically feasible. - will continue to monitor for additional nutrition-related needs.  NUTRITION DIAGNOSIS:   Increased nutrient needs related to acute illness as evidenced by estimated needs.  GOAL:   Patient will meet greater than or equal to 90% of their needs  MONITOR:   Diet advancement, Labs, Weight trends  REASON FOR ASSESSMENT:   Malnutrition Screening Tool  ASSESSMENT:   49 year-old male with medical history of anxiety, depression, and asthma. He presented to the ED from prison due to persistent perianal drainage. He developed an abscess ~2 weeks PTA and had I&D performed at prison. Despite abx, he continued to have purulent drainage which patient reported is daily, constant. He also reported abdominal pain. CT showed perianal abscess with extension along the rectum into the pelvis and along the abdominal wall, external to the peritoneum. General surgery was consulted for evaluation.  Patient has been NPO since admission pending CT-guided drain placement 2/2 abscess.   Patient laying in bed, handcuffed to bed, with police officer at bedside. Patient reports that he was previously using Klonopin, methadone, and crystal meth and had gotten down to the 125 lb range. After he stopped using, he believes he was up to around 160 lb, but that after getting to jail on 02/07/20 he had a poor appetite and intakes 2/2 withdrawal and esophageal pain d/t persistent vomiting and that he dropped down to around 140 lb.  Weight today is 146 lb and PTA the most recently documented weight was on 07/10/17 when he weighed 192 lb.   Patient reports that he was on 2 courses of abx and that he did not experience a change in BMs/diarrhea or taste changes during that time.   He reports that the first abx led to abdominal pain but that the second one did not.   Patient reports that he is no  longer experiencing esophageal pain and that he is feeling very hungry today.    Labs reviewed; BUN: 22 mg/dl, Ca: 8.5 mg/dl. Medications reviewed. IVF; LR @ 75 ml/hr.    NUTRITION - FOCUSED PHYSICAL EXAM:  did not complete at this time.   Diet Order:   Diet Order            Diet NPO time specified Except for: Sips with Meds  Diet effective midnight                 EDUCATION NEEDS:   No education needs have been identified at this time  Skin:  Skin Assessment: Reviewed RN Assessment  Last BM:  12/1  Height:   Ht Readings from Last 1 Encounters:  02/25/20 5\' 9"  (1.753 m)    Weight:   Wt Readings from Last 1 Encounters:  02/25/20 66.1 kg    Estimated Nutritional Needs:  Kcal:  1980-2150 kcal Protein:  90-105 grams Fluid:  >/= 2 L/day      14/02/21, MS, RD, LDN, CNSC Inpatient Clinical Dietitian RD pager # available in AMION  After hours/weekend pager # available in [facility]

## 2020-02-25 NOTE — Plan of Care (Signed)
  Problem: Education: Goal: Knowledge of General Education information will improve Description Including pain rating scale, medication(s)/side effects and non-pharmacologic comfort measures Outcome: Progressing   

## 2020-02-26 MED ORDER — MAGIC MOUTHWASH
15.0000 mL | Freq: Four times a day (QID) | ORAL | Status: DC | PRN
  Filled 2020-02-26: qty 15

## 2020-02-26 MED ORDER — TRAMADOL HCL 50 MG PO TABS
50.0000 mg | ORAL_TABLET | Freq: Four times a day (QID) | ORAL | Status: DC | PRN
  Administered 2020-02-26 – 2020-02-29 (×7): 100 mg via ORAL
  Filled 2020-02-26 (×8): qty 2

## 2020-02-26 MED ORDER — MENTHOL 3 MG MT LOZG: 1.0000 | LOZENGE | OROMUCOSAL | Status: DC | PRN

## 2020-02-26 MED ORDER — PHENOL 1.4 % MT LIQD: 2.0000 | OROMUCOSAL | Status: DC | PRN

## 2020-02-26 MED ORDER — HYDROMORPHONE HCL 1 MG/ML IJ SOLN
0.5000 mg | INTRAMUSCULAR | Status: DC | PRN
  Administered 2020-02-26: 1 mg via INTRAVENOUS
  Filled 2020-02-26: qty 1

## 2020-02-26 MED ORDER — METHOCARBAMOL 500 MG PO TABS
500.0000 mg | ORAL_TABLET | Freq: Four times a day (QID) | ORAL | Status: DC | PRN
  Administered 2020-02-26: 1000 mg via ORAL
  Administered 2020-02-26: 500 mg via ORAL
  Administered 2020-02-27 (×2): 1000 mg via ORAL
  Filled 2020-02-26 (×2): qty 2
  Filled 2020-02-26: qty 1
  Filled 2020-02-26: qty 2

## 2020-02-26 MED ORDER — LACTATED RINGERS IV BOLUS: 1000.0000 mL | Freq: Three times a day (TID) | INTRAVENOUS | Status: DC | PRN

## 2020-02-26 MED ORDER — GABAPENTIN 300 MG PO CAPS
300.0000 mg | ORAL_CAPSULE | Freq: Two times a day (BID) | ORAL | Status: DC
  Administered 2020-02-26 – 2020-02-29 (×8): 300 mg via ORAL
  Filled 2020-02-26 (×9): qty 1

## 2020-02-26 MED ORDER — ENOXAPARIN SODIUM 40 MG/0.4ML ~~LOC~~ SOLN
40.0000 mg | Freq: Every day | SUBCUTANEOUS | Status: DC
  Administered 2020-02-26 – 2020-02-29 (×4): 40 mg via SUBCUTANEOUS
  Filled 2020-02-26 (×5): qty 0.4

## 2020-02-26 MED ORDER — ENSURE SURGERY PO LIQD
237.0000 mL | Freq: Two times a day (BID) | ORAL | Status: DC
  Administered 2020-02-26 – 2020-03-01 (×9): 237 mL via ORAL

## 2020-02-26 MED ORDER — METHOCARBAMOL 1000 MG/10ML IJ SOLN
1000.0000 mg | Freq: Four times a day (QID) | INTRAVENOUS | Status: DC | PRN
  Filled 2020-02-26: qty 10

## 2020-02-26 MED ORDER — LIP MEDEX EX OINT
1.0000 "application " | TOPICAL_OINTMENT | Freq: Two times a day (BID) | CUTANEOUS | Status: DC
  Administered 2020-02-26 – 2020-03-01 (×9): 1 via TOPICAL
  Filled 2020-02-26: qty 7

## 2020-02-26 MED ORDER — PROCHLORPERAZINE EDISYLATE 10 MG/2ML IJ SOLN: 5.0000 mg | INTRAMUSCULAR | Status: DC | PRN

## 2020-02-26 MED ORDER — POLYETHYLENE GLYCOL 3350 17 G PO PACK
17.0000 g | PACK | Freq: Two times a day (BID) | ORAL | Status: DC
  Administered 2020-02-26 – 2020-03-01 (×9): 17 g via ORAL
  Filled 2020-02-26 (×9): qty 1

## 2020-02-26 MED ORDER — ACETAMINOPHEN 500 MG PO TABS
1000.0000 mg | ORAL_TABLET | Freq: Three times a day (TID) | ORAL | Status: DC
  Administered 2020-02-26 – 2020-02-29 (×12): 1000 mg via ORAL
  Filled 2020-02-26 (×13): qty 2

## 2020-02-26 MED ORDER — HYDROMORPHONE HCL 1 MG/ML IJ SOLN
0.5000 mg | INTRAMUSCULAR | Status: DC | PRN
  Administered 2020-02-26 – 2020-02-28 (×7): 1 mg via INTRAVENOUS
  Administered 2020-02-28: 0.5 mg via INTRAVENOUS
  Administered 2020-02-28 – 2020-02-29 (×2): 1 mg via INTRAVENOUS
  Filled 2020-02-26 (×11): qty 1

## 2020-02-26 NOTE — Plan of Care (Signed)

## 2020-02-26 NOTE — Progress Notes (Signed)
Subjective: Tolerating a regular diet.  Still with pain.  Awake most of the night.  Thinks his pain is slightly better than admission.  Drain is causing him discomfort.  ROS: See above, otherwise other systems negative  Objective: Vital signs in last 24 hours: Temp:  [98.6 F (37 C)-99.3 F (37.4 C)] 98.8 F (37.1 C) (12/03 0603) Pulse Rate:  [88-105] 102 (12/03 0603) Resp:  [11-19] 18 (12/03 0603) BP: (120-145)/(63-84) 139/75 (12/03 0603) SpO2:  [97 %-99 %] 97 % (12/03 0603) Last BM Date: 02/24/20  Intake/Output from previous day: 12/02 0701 - 12/03 0700 In: 1793 [P.O.:577; I.V.:1071.8; IV Piggyback:124.2] Out: 730 [Urine:650; Drains:80] Intake/Output this shift: Total I/O In: 47.2 [IV Piggyback:47.2] Out: -   PE: Heart: regular Lungs: CTAB Abd: drain in anterior abdomen with tan purulent drainage in place.  Appropriately tender, soft, ND Buttock: left gluteal tract noted with similar drainage present still as anterior drain.  Lab Results:  Recent Labs    02/24/20 1851 02/25/20 0338  WBC 8.3 7.3  HGB 12.0* 9.7*  HCT 35.3* 29.5*  PLT 674* 478*   BMET Recent Labs    02/24/20 1851 02/25/20 0338  NA 137 137  K 5.7* 4.1  CL 100 102  CO2 25 28  GLUCOSE 91 124*  BUN 22* 22*  CREATININE 0.97 0.95  CALCIUM 9.0 8.5*   PT/INR Recent Labs    02/25/20 0338  LABPROT 14.0  INR 1.1   CMP     Component Value Date/Time   NA 137 02/25/2020 0338   K 4.1 02/25/2020 0338   CL 102 02/25/2020 0338   CO2 28 02/25/2020 0338   GLUCOSE 124 (H) 02/25/2020 0338   BUN 22 (H) 02/25/2020 0338   CREATININE 0.95 02/25/2020 0338   CALCIUM 8.5 (L) 02/25/2020 0338   PROT 8.3 (H) 02/24/2020 1851   ALBUMIN 3.4 (L) 02/24/2020 1851   AST 38 02/24/2020 1851   ALT 40 02/24/2020 1851   ALKPHOS 58 02/24/2020 1851   BILITOT 1.5 (H) 02/24/2020 1851   GFRNONAA >60 02/25/2020 0338   GFRAA >60 08/14/2019 2021   Lipase  No results found for:  LIPASE     Studies/Results: CT ABDOMEN PELVIS W CONTRAST  Result Date: 02/24/2020 CLINICAL DATA:  Abdominal pain. Anal rectal abscess refractory to 20 days of antibiotics. EXAM: CT ABDOMEN AND PELVIS WITH CONTRAST TECHNIQUE: Multidetector CT imaging of the abdomen and pelvis was performed using the standard protocol following bolus administration of intravenous contrast. CONTRAST:  OMNIPAQUE IOHEXOL 300 MG/ML  SOLN COMPARISON:  None. FINDINGS: Lower chest: Perw clear lung bases.  Heart normal in size. Hepatobiliary: No focal liver abnormality is seen. No gallstones, gallbladder wall thickening, or biliary dilatation. Pancreas: Unremarkable. No pancreatic ductal dilatation or surrounding inflammatory changes. Spleen: Normal in size without focal abnormality. Adrenals/Urinary Tract: Adrenal glands are unremarkable. Kidneys are normal, without renal calculi, focal lesion, or hydronephrosis. Bladder is unremarkable. Stomach/Bowel: Normal stomach and small bowel. Moderate increased stool mildly distends the right and transverse colon. No colonic wall thickening. Left perineal inflammation with a small abscess. No other evidence of inflammation. Normal appendix visualized. Vascular/Lymphatic: No significant vascular findings are present. No enlarged abdominal or pelvic lymph nodes. Reproductive: Prostate is unremarkable. Other: There are extraperitoneal abscesses in the pelvis and low abdomen. Extending from the left perianal/perirectal abscess, there is a larger left sided pelvic abscess measuring 12 cm from anterior to posterior, approximately 4.3 cm right to left. This abscess is  contiguous with abscesses that extend along the deep margin of the anterior abdominal wall fascia, superficial to the peritoneum. On the left this extends superiorly to the level of the left kidney, at least 23 cm in length. This extends on the right from the right anterior inferior pelvis to the level of the inferior margin of  the liver. The fluid collections connect across the anterior midline, anterior/superior to the bladder. Musculoskeletal: No fracture or acute finding.  No bone lesion. IMPRESSION: 1. Left perirectal/perianal abscess with associated inflammation connects to a larger extraperitoneal abscesses in the pelvis that extend along the deep margin of the anterior abdominal wall, superficial to the peritoneum. Largest portion of the contiguous collections is in the left pelvis adjacent to the bladder. 2. No other acute abnormality within the abdomen or pelvis. 3. Moderate increased colonic stool burden with mild distension of the right and transverse colon. Electronically Signed   By: Amie Portland M.D.   On: 02/24/2020 20:11   CT IMAGE GUIDED DRAINAGE BY PERCUTANEOUS CATHETER  Result Date: 02/25/2020 INDICATION: 49 year old male with progressive perirectal abscess. He presents for CT-guided drain placement. EXAM: CT-guided drain placement MEDICATIONS: The patient is currently admitted to the hospital and receiving intravenous antibiotics. The antibiotics were administered within an appropriate time frame prior to the initiation of the procedure. ANESTHESIA/SEDATION: Fentanyl 100 mcg IV; Versed 2 mg IV Moderate Sedation Time:  17 minutes The patient was continuously monitored during the procedure by the interventional radiology nurse under my direct supervision. COMPLICATIONS: None immediate. PROCEDURE: Informed written consent was obtained from the patient after a thorough discussion of the procedural risks, benefits and alternatives. All questions were addressed. Maximal Sterile Barrier Technique was utilized including caps, mask, sterile gowns, sterile gloves, sterile drape, hand hygiene and skin antiseptic. A timeout was performed prior to the initiation of the procedure. A planning axial CT scan was performed. The fluid collection extending superiorly from the left perirectal space was successfully identified. An  anterior approach in the left medial inguinal recess was selected. The skin was marked before being sterilely prepped and draped in the standard fashion using chlorhexidine skin prep. Local anesthesia was attained by infiltration with 1% lidocaine. A small dermatotomy was made. Under intermittent CT guidance, an 18 gauge trocar needle was advanced into the fluid collection. A 0.035 wire was then coiled in the fluid collection. The skin tract was dilated to 12 [place]. A Cook 12 [place] all-purpose drainage catheter was then advanced over the wire and formed. Aspiration yields approximately 20 mL of thick purulent fluid. A sample was sent for Gram stain and culture. The drainage catheter was then flushed, connected to JP bulb suction and secured to the skin with 0 Prolene suture. CT imaging demonstrates a well-positioned drainage catheter without evidence of complication. The patient tolerated the procedure well. IMPRESSION: Successful placement of 12 French percutaneous drainage catheter into the left perirectal abscess. Aspirated purulent fluid sent for Gram stain and culture. Electronically Signed   By: Malachy Moan M.D.   On: 02/25/2020 16:21    Anti-infectives: Anti-infectives (From admission, onward)   Start     Dose/Rate Route Frequency Ordered Stop   02/24/20 2215  piperacillin-tazobactam (ZOSYN) IVPB 3.375 g        3.375 g 12.5 mL/hr over 240 Minutes Intravenous Every 8 hours 02/24/20 2214         Assessment/Plan BPH Incarcerated  Perianal abscess with extension into his pelvis -IR drain placed yesterday with good drainage noted -tolerating diet -  on IV zosyn -likely benefit from outpatient colonoscopy down the line -WBC normal -cx - staph aureus, reincubated for better growth   FEN:  regular diet/SLIV ID:  Zosyn 12/1>> day 2 SVT:  SCD/ Lovenox    LOS: 1 day    Letha Cape , [facility] 02/26/2020, 10:01 AM Please see Amion for pager number during  day hours 7:00am-4:30pm or 7:00am -11:30am on weekends

## 2020-02-26 NOTE — Progress Notes (Signed)
Called materials earlier today for a kpad to fulfill order- out of stock. Giving patient heat packs and replenishing them. We have encouraged the patient to ambulate today but says  "I'm way too tired, no". Keeps saying will do later but explained to patient benefits of ambulation.

## 2020-02-26 NOTE — Progress Notes (Signed)
Wound care performed per 4 hours per md order. Patient tolerated well. Dressing is dry clean intact at this time and pain medications given.

## 2020-02-26 NOTE — Progress Notes (Signed)
Referring Physician(s): Allen,S  Supervising Physician: Irish Lack  Patient Status:  [facility] - In-pt  Chief Complaint:  Abdominal/pelvic pain/abscess  Subjective: Patient continues to have some lower abdominal/pelvic discomfort; currently without nausea or vomiting   Allergies: Patient has no known allergies.  Medications: Prior to Admission medications   Medication Sig Start Date End Date Taking? Authorizing Provider  acetaminophen (TYLENOL) 500 MG tablet Take 500-1,000 mg by mouth every 6 (six) hours as needed for mild pain.   Yes [provider]  loperamide (IMODIUM A-D) 2 MG tablet Take 2 mg by mouth 4 (four) times daily as needed for diarrhea or loose stools.   Yes [provider]  tamsulosin (FLOMAX) 0.4 MG CAPS capsule Take 1 capsule (0.4 mg total) by mouth daily. Patient not taking: Reported on 02/24/2020 07/10/17   Riki Altes, MD     Vital Signs: BP 134/63 (BP Location: Left Arm)   Pulse (!) 114   Temp 100 F (37.8 C)   Resp 18   Ht 5\' 9"  (1.753 m)   Wt 145 lb 11.2 oz (66.1 kg)   SpO2 97%   BMI 21.52 kg/m   Physical Exam awake, alert.  Left lower abdominal drain intact, output 80 cc of turbid beige-colored fluid, insertion site okay, slightly tender to palpation  Imaging: CT ABDOMEN PELVIS W CONTRAST  Result Date: 02/24/2020 CLINICAL DATA:  Abdominal pain. Anal rectal abscess refractory to 20 days of antibiotics. EXAM: CT ABDOMEN AND PELVIS WITH CONTRAST TECHNIQUE: Multidetector CT imaging of the abdomen and pelvis was performed using the standard protocol following bolus administration of intravenous contrast. CONTRAST:  14/03/2019 OMNIPAQUE IOHEXOL 300 MG/ML  SOLN COMPARISON:  None. FINDINGS: Lower chest: Perw clear lung bases.  Heart normal in size. Hepatobiliary: No focal liver abnormality is seen. No gallstones, gallbladder wall thickening, or biliary dilatation. Pancreas: Unremarkable. No pancreatic ductal dilatation or surrounding  inflammatory changes. Spleen: Normal in size without focal abnormality. Adrenals/Urinary Tract: Adrenal glands are unremarkable. Kidneys are normal, without renal calculi, focal lesion, or hydronephrosis. Bladder is unremarkable. Stomach/Bowel: Normal stomach and small bowel. Moderate increased stool mildly distends the right and transverse colon. No colonic wall thickening. Left perineal inflammation with a small abscess. No other evidence of inflammation. Normal appendix visualized. Vascular/Lymphatic: No significant vascular findings are present. No enlarged abdominal or pelvic lymph nodes. Reproductive: Prostate is unremarkable. Other: There are extraperitoneal abscesses in the pelvis and low abdomen. Extending from the left perianal/perirectal abscess, there is a larger left sided pelvic abscess measuring 12 cm from anterior to posterior, approximately 4.3 cm right to left. This abscess is contiguous with abscesses that extend along the deep margin of the anterior abdominal wall fascia, superficial to the peritoneum. On the left this extends superiorly to the level of the left kidney, at least 23 cm in length. This extends on the right from the right anterior inferior pelvis to the level of the inferior margin of the liver. The fluid collections connect across the anterior midline, anterior/superior to the bladder. Musculoskeletal: No fracture or acute finding.  No bone lesion. IMPRESSION: 1. Left perirectal/perianal abscess with associated inflammation connects to a larger extraperitoneal abscesses in the pelvis that extend along the deep margin of the anterior abdominal wall, superficial to the peritoneum. Largest portion of the contiguous collections is in the left pelvis adjacent to the bladder. 2. No other acute abnormality within the abdomen or pelvis. 3. Moderate increased colonic stool burden with mild distension of the right and  transverse colon. Electronically Signed   By: Amie Portland M.D.   On:  02/24/2020 20:11   CT IMAGE GUIDED DRAINAGE BY PERCUTANEOUS CATHETER  Result Date: 02/25/2020 INDICATION: 49 year old male with progressive perirectal abscess. He presents for CT-guided drain placement. EXAM: CT-guided drain placement MEDICATIONS: The patient is currently admitted to the hospital and receiving intravenous antibiotics. The antibiotics were administered within an appropriate time frame prior to the initiation of the procedure. ANESTHESIA/SEDATION: Fentanyl 100 mcg IV; Versed 2 mg IV Moderate Sedation Time:  17 minutes The patient was continuously monitored during the procedure by the interventional radiology nurse under my direct supervision. COMPLICATIONS: None immediate. PROCEDURE: Informed written consent was obtained from the patient after a thorough discussion of the procedural risks, benefits and alternatives. All questions were addressed. Maximal Sterile Barrier Technique was utilized including caps, mask, sterile gowns, sterile gloves, sterile drape, hand hygiene and skin antiseptic. A timeout was performed prior to the initiation of the procedure. A planning axial CT scan was performed. The fluid collection extending superiorly from the left perirectal space was successfully identified. An anterior approach in the left medial inguinal recess was selected. The skin was marked before being sterilely prepped and draped in the standard fashion using chlorhexidine skin prep. Local anesthesia was attained by infiltration with 1% lidocaine. A small dermatotomy was made. Under intermittent CT guidance, an 18 gauge trocar needle was advanced into the fluid collection. A 0.035 wire was then coiled in the fluid collection. The skin tract was dilated to 12 [place]. A Cook 12 [place] all-purpose drainage catheter was then advanced over the wire and formed. Aspiration yields approximately 20 mL of thick purulent fluid. A sample was sent for Gram stain and culture. The drainage catheter was then flushed,  connected to JP bulb suction and secured to the skin with 0 Prolene suture. CT imaging demonstrates a well-positioned drainage catheter without evidence of complication. The patient tolerated the procedure well. IMPRESSION: Successful placement of 12 French percutaneous drainage catheter into the left perirectal abscess. Aspirated purulent fluid sent for Gram stain and culture. Electronically Signed   By: Malachy Moan M.D.   On: 02/25/2020 16:21    Labs:  CBC: Recent Labs    08/14/19 2021 02/24/20 1851 02/25/20 0338  WBC 7.3 8.3 7.3  HGB 13.4 12.0* 9.7*  HCT 38.6* 35.3* 29.5*  PLT 238 674* 478*    COAGS: Recent Labs    02/25/20 0338  INR 1.1    BMP: Recent Labs    08/14/19 2021 02/24/20 1851 02/25/20 0338  NA 137 137 137  K 4.1 5.7* 4.1  CL 101 100 102  CO2 28 25 28   GLUCOSE 101* 91 124*  BUN 25* 22* 22*  CALCIUM 9.2 9.0 8.5*  CREATININE 0.90 0.97 0.95  GFRNONAA >60 >60 >60  GFRAA >60  --   --     LIVER FUNCTION TESTS: Recent Labs    08/14/19 2021 02/24/20 1851  BILITOT 0.6 1.5*  AST 15 38  ALT 16 40  ALKPHOS 85 58  PROT 7.8 8.3*  ALBUMIN 4.8 3.4*    Assessment and Plan: Patient with history of progressive perirectal abscess, status post drain placement on 12/2; temp 100, no new labs today, drain fluid cultures with moderate gram-positive cocci and rare gram-negative rods; continue with drain irrigation, output monitoring, lab checks; once output minimal or if clinical status worsens obtain follow-up CT scan.   Electronically Signed: D. 14/2, PA-C 02/26/2020, 3:12 PM   I spent  a total of 15 minutes at the the patient's bedside AND on the patient's hospital floor or unit, greater than 50% of which was counseling/coordinating care for left abdominal/pelvic abscess drain    Patient ID: [name], male   DOB: 08/27/1970, 49 y.o.   MRN: [id number]

## 2020-02-27 MED ORDER — DOXYCYCLINE HYCLATE 100 MG PO TABS
100.0000 mg | ORAL_TABLET | Freq: Two times a day (BID) | ORAL | Status: DC
  Administered 2020-02-27 – 2020-03-01 (×6): 100 mg via ORAL
  Filled 2020-02-27 (×6): qty 1

## 2020-02-27 NOTE — Progress Notes (Addendum)
Pt ambulated x 3 in the hallways, tolerated well. Pain well controlled with PRN medications. JP dressing changed, scant drainage around the incision. JP drain output is 52cc. Pt doing sitz bath. Able to tolerate regular diet, no c/o N/V voiced. Monitoring closely.

## 2020-02-27 NOTE — Progress Notes (Signed)
Subjective: Tolerating a regular diet.  Still with abd pain.    Objective: Vital signs in last 24 hours: Temp:  [98.3 F (36.8 C)-100 F (37.8 C)] 98.8 F (37.1 C) (12/04 0900) Pulse Rate:  [86-114] 96 (12/04 0900) Resp:  [16-18] 16 (12/04 0900) BP: (113-134)/(63-75) 127/72 (12/04 0900) SpO2:  [96 %-97 %] 96 % (12/04 0900) Last BM Date: 02/24/20  Intake/Output from previous day: 12/03 0701 - 12/04 0700 In: 901.5 [P.O.:717; I.V.:15; IV Piggyback:164.5] Out: 1129 [Urine:995; Drains:134] Intake/Output this shift: Total I/O In: 240 [P.O.:240] Out: 300 [Urine:300]  PE: Heart: regular Lungs: CTAB Abd: drain in anterior abdomen with tan purulent drainage in place.  Appropriately tender, soft, ND   Lab Results:  Recent Labs    02/24/20 1851 02/25/20 0338  WBC 8.3 7.3  HGB 12.0* 9.7*  HCT 35.3* 29.5*  PLT 674* 478*   BMET Recent Labs    02/24/20 1851 02/25/20 0338  NA 137 137  K 5.7* 4.1  CL 100 102  CO2 25 28  GLUCOSE 91 124*  BUN 22* 22*  CREATININE 0.97 0.95  CALCIUM 9.0 8.5*   PT/INR Recent Labs    02/25/20 0338  LABPROT 14.0  INR 1.1   CMP     Component Value Date/Time   NA 137 02/25/2020 0338   K 4.1 02/25/2020 0338   CL 102 02/25/2020 0338   CO2 28 02/25/2020 0338   GLUCOSE 124 (H) 02/25/2020 0338   BUN 22 (H) 02/25/2020 0338   CREATININE 0.95 02/25/2020 0338   CALCIUM 8.5 (L) 02/25/2020 0338   PROT 8.3 (H) 02/24/2020 1851   ALBUMIN 3.4 (L) 02/24/2020 1851   AST 38 02/24/2020 1851   ALT 40 02/24/2020 1851   ALKPHOS 58 02/24/2020 1851   BILITOT 1.5 (H) 02/24/2020 1851   GFRNONAA >60 02/25/2020 0338   GFRAA >60 08/14/2019 2021   Lipase  No results found for: LIPASE     Studies/Results: CT IMAGE GUIDED DRAINAGE BY PERCUTANEOUS CATHETER  Result Date: 02/25/2020 INDICATION: 49 year old male with progressive perirectal abscess. He presents for CT-guided drain placement. EXAM: CT-guided drain placement MEDICATIONS: The patient  is currently admitted to the hospital and receiving intravenous antibiotics. The antibiotics were administered within an appropriate time frame prior to the initiation of the procedure. ANESTHESIA/SEDATION: Fentanyl 100 mcg IV; Versed 2 mg IV Moderate Sedation Time:  17 minutes The patient was continuously monitored during the procedure by the interventional radiology nurse under my direct supervision. COMPLICATIONS: None immediate. PROCEDURE: Informed written consent was obtained from the patient after a thorough discussion of the procedural risks, benefits and alternatives. All questions were addressed. Maximal Sterile Barrier Technique was utilized including caps, mask, sterile gowns, sterile gloves, sterile drape, hand hygiene and skin antiseptic. A timeout was performed prior to the initiation of the procedure. A planning axial CT scan was performed. The fluid collection extending superiorly from the left perirectal space was successfully identified. An anterior approach in the left medial inguinal recess was selected. The skin was marked before being sterilely prepped and draped in the standard fashion using chlorhexidine skin prep. Local anesthesia was attained by infiltration with 1% lidocaine. A small dermatotomy was made. Under intermittent CT guidance, an 18 gauge trocar needle was advanced into the fluid collection. A 0.035 wire was then coiled in the fluid collection. The skin tract was dilated to 12 [place]. A Cook 12 [place] all-purpose drainage catheter was then advanced over the wire and formed. Aspiration  yields approximately 20 mL of thick purulent fluid. A sample was sent for Gram stain and culture. The drainage catheter was then flushed, connected to JP bulb suction and secured to the skin with 0 Prolene suture. CT imaging demonstrates a well-positioned drainage catheter without evidence of complication. The patient tolerated the procedure well. IMPRESSION: Successful placement of 12 French  percutaneous drainage catheter into the left perirectal abscess. Aspirated purulent fluid sent for Gram stain and culture. Electronically Signed   By: Malachy Moan M.D.   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

## 2020-02-27 NOTE — Progress Notes (Signed)
Received a call from radiologist, Pt's drain fluid culture is positive for MRSA. On Call provider is paged, awaiting response.

## 2020-02-27 NOTE — Progress Notes (Signed)
Pharmacy Note  Few MRSA in abscess cx. D/w on call MD Dr Gerrit Friends.  Plan: Doxy 100 mg po BID Continue Zosyn  Herby Abraham, Pharm.D 02/27/2020 3:34 PM

## 2020-02-27 NOTE — Plan of Care (Signed)

## 2020-02-27 NOTE — Progress Notes (Signed)
Initiated contact precaution, education provided.

## 2020-02-27 NOTE — Progress Notes (Signed)
Referring Physician(s): Allen,S  Supervising Physician: Richarda Overlie  Patient Status:  [facility] - In-pt  Chief Complaint:  Abdominal/pelvic pain/abscess  Subjective: Patient continues to have some lower abdominal/pelvic discomfort; denies nausea/ vomiting; states pain extends from pelvic region down to left thigh.   Allergies: Patient has no known allergies.  Medications: Prior to Admission medications   Medication Sig Start Date End Date Taking? Authorizing Provider  acetaminophen (TYLENOL) 500 MG tablet Take 500-1,000 mg by mouth every 6 (six) hours as needed for mild pain.   Yes [provider]  loperamide (IMODIUM A-D) 2 MG tablet Take 2 mg by mouth 4 (four) times daily as needed for diarrhea or loose stools.   Yes [provider]  tamsulosin (FLOMAX) 0.4 MG CAPS capsule Take 1 capsule (0.4 mg total) by mouth daily. Patient not taking: Reported on 02/24/2020 07/10/17   Riki Altes, MD     Vital Signs: BP 117/60 (BP Location: Right Arm)   Pulse 98   Temp 98.3 F (36.8 C)   Resp 19   Ht 5\' 9"  (1.753 m)   Wt 145 lb 11.2 oz (66.1 kg)   SpO2 95%   BMI 21.52 kg/m   Physical Exam awake, alert.  abdominal drain intact, insertion site okay,, mild- mod tender to palpation, output 135 cc yesterday, 50 cc today of turbid beige-colored fluid  Imaging: CT ABDOMEN PELVIS W CONTRAST  Result Date: 02/24/2020 CLINICAL DATA:  Abdominal pain. Anal rectal abscess refractory to 20 days of antibiotics. EXAM: CT ABDOMEN AND PELVIS WITH CONTRAST TECHNIQUE: Multidetector CT imaging of the abdomen and pelvis was performed using the standard protocol following bolus administration of intravenous contrast. CONTRAST:  14/03/2019 OMNIPAQUE IOHEXOL 300 MG/ML  SOLN COMPARISON:  None. FINDINGS: Lower chest: Perw clear lung bases.  Heart normal in size. Hepatobiliary: No focal liver abnormality is seen. No gallstones, gallbladder wall thickening, or biliary dilatation. Pancreas:  Unremarkable. No pancreatic ductal dilatation or surrounding inflammatory changes. Spleen: Normal in size without focal abnormality. Adrenals/Urinary Tract: Adrenal glands are unremarkable. Kidneys are normal, without renal calculi, focal lesion, or hydronephrosis. Bladder is unremarkable. Stomach/Bowel: Normal stomach and small bowel. Moderate increased stool mildly distends the right and transverse colon. No colonic wall thickening. Left perineal inflammation with a small abscess. No other evidence of inflammation. Normal appendix visualized. Vascular/Lymphatic: No significant vascular findings are present. No enlarged abdominal or pelvic lymph nodes. Reproductive: Prostate is unremarkable. Other: There are extraperitoneal abscesses in the pelvis and low abdomen. Extending from the left perianal/perirectal abscess, there is a larger left sided pelvic abscess measuring 12 cm from anterior to posterior, approximately 4.3 cm right to left. This abscess is contiguous with abscesses that extend along the deep margin of the anterior abdominal wall fascia, superficial to the peritoneum. On the left this extends superiorly to the level of the left kidney, at least 23 cm in length. This extends on the right from the right anterior inferior pelvis to the level of the inferior margin of the liver. The fluid collections connect across the anterior midline, anterior/superior to the bladder. Musculoskeletal: No fracture or acute finding.  No bone lesion. IMPRESSION: 1. Left perirectal/perianal abscess with associated inflammation connects to a larger extraperitoneal abscesses in the pelvis that extend along the deep margin of the anterior abdominal wall, superficial to the peritoneum. Largest portion of the contiguous collections is in the left pelvis adjacent to the bladder. 2. No other acute abnormality within the abdomen or pelvis. 3. Moderate increased  colonic stool burden with mild distension of the right and transverse  colon. Electronically Signed   By: Amie Portland M.D.   On: 02/24/2020 20:11   CT IMAGE GUIDED DRAINAGE BY PERCUTANEOUS CATHETER  Result Date: 02/25/2020 INDICATION: 49 year old male with progressive perirectal abscess. He presents for CT-guided drain placement. EXAM: CT-guided drain placement MEDICATIONS: The patient is currently admitted to the hospital and receiving intravenous antibiotics. The antibiotics were administered within an appropriate time frame prior to the initiation of the procedure. ANESTHESIA/SEDATION: Fentanyl 100 mcg IV; Versed 2 mg IV Moderate Sedation Time:  17 minutes The patient was continuously monitored during the procedure by the interventional radiology nurse under my direct supervision. COMPLICATIONS: None immediate. PROCEDURE: Informed written consent was obtained from the patient after a thorough discussion of the procedural risks, benefits and alternatives. All questions were addressed. Maximal Sterile Barrier Technique was utilized including caps, mask, sterile gowns, sterile gloves, sterile drape, hand hygiene and skin antiseptic. A timeout was performed prior to the initiation of the procedure. A planning axial CT scan was performed. The fluid collection extending superiorly from the left perirectal space was successfully identified. An anterior approach in the left medial inguinal recess was selected. The skin was marked before being sterilely prepped and draped in the standard fashion using chlorhexidine skin prep. Local anesthesia was attained by infiltration with 1% lidocaine. A small dermatotomy was made. Under intermittent CT guidance, an 18 gauge trocar needle was advanced into the fluid collection. A 0.035 wire was then coiled in the fluid collection. The skin tract was dilated to 12 [place]. A Cook 12 [place] all-purpose drainage catheter was then advanced over the wire and formed. Aspiration yields approximately 20 mL of thick purulent fluid. A sample was sent for Gram  stain and culture. The drainage catheter was then flushed, connected to JP bulb suction and secured to the skin with 0 Prolene suture. CT imaging demonstrates a well-positioned drainage catheter without evidence of complication. The patient tolerated the procedure well. IMPRESSION: Successful placement of 12 French percutaneous drainage catheter into the left perirectal abscess. Aspirated purulent fluid sent for Gram stain and culture. Electronically Signed   By: Malachy Moan M.D.   On: 02/25/2020 16:21    Labs:  CBC: Recent Labs    08/14/19 2021 02/24/20 1851 02/25/20 0338  WBC 7.3 8.3 7.3  HGB 13.4 12.0* 9.7*  HCT 38.6* 35.3* 29.5*  PLT 238 674* 478*    COAGS: Recent Labs    02/25/20 0338  INR 1.1    BMP: Recent Labs    08/14/19 2021 02/24/20 1851 02/25/20 0338  NA 137 137 137  K 4.1 5.7* 4.1  CL 101 100 102  CO2 28 25 28   GLUCOSE 101* 91 124*  BUN 25* 22* 22*  CALCIUM 9.2 9.0 8.5*  CREATININE 0.90 0.97 0.95  GFRNONAA >60 >60 >60  GFRAA >60  --   --     LIVER FUNCTION TESTS: Recent Labs    08/14/19 2021 02/24/20 1851  BILITOT 0.6 1.5*  AST 15 38  ALT 16 40  ALKPHOS 85 58  PROT 7.8 8.3*  ALBUMIN 4.8 3.4*    Assessment and Plan: Patient with history of progressive perirectal abscess, status post drain placement on 12/2;  afebrile, no new labs today; drain fluid cultures growing MRSA- nurse updated;  continue with drain irrigation, output monitoring, lab checks; once output minimal or if clinical status worsens obtain follow-up CT scan.  Electronically Signed: D. 14/2, PA-C 02/27/2020,  3:23 PM   I spent a total of 15 minutes at the the patient's bedside AND on the patient's hospital floor or unit, greater than 50% of which was counseling/coordinating care for left abdominal/pelvic abscess drain    Patient ID: [name], male   DOB: March 09, 1971, 49 y.o.   MRN: [id number]

## 2020-02-28 LAB — AEROBIC CULTURE W GRAM STAIN (SUPERFICIAL SPECIMEN): Special Requests: NORMAL

## 2020-02-28 MED ORDER — AMOXICILLIN 500 MG PO CAPS
500.0000 mg | ORAL_CAPSULE | Freq: Three times a day (TID) | ORAL | Status: DC
  Administered 2020-02-28 – 2020-03-01 (×6): 500 mg via ORAL
  Filled 2020-02-28 (×7): qty 1

## 2020-02-28 NOTE — Progress Notes (Addendum)
       Subjective: Tolerating a regular diet.  Still with abd pain.    Objective: Vital signs in last 24 hours: Temp:  [98.2 F (36.8 C)-98.8 F (37.1 C)] 98.8 F (37.1 C) (12/05 0543) Pulse Rate:  [88-103] 88 (12/05 0543) Resp:  [16-20] 18 (12/05 0543) BP: (103-127)/(60-72) 103/62 (12/05 0543) SpO2:  [95 %-100 %] 100 % (12/05 0543) Last BM Date: 02/27/20  Intake/Output from previous day: 12/04 0701 - 12/05 0700 In: 892.7 [P.O.:720; I.V.:15; IV Piggyback:147.7] Out: 2192 [Urine:2075; Drains:117] Intake/Output this shift: No intake/output data recorded.  PE: Heart: regular Lungs: CTAB Abd: drain in anterior abdomen with tan purulent drainage in place.  Appropriately tender, soft, ND   Lab Results:  No results for input(s): WBC, HGB, HCT, PLT in the last 72 hours. BMET No results for input(s): NA, K, CL, CO2, GLUCOSE, BUN, CREATININE, CALCIUM in the last 72 hours. PT/INR No results for input(s): LABPROT, INR in the last 72 hours. CMP     Component Value Date/Time   NA 137 02/25/2020 0338   K 4.1 02/25/2020 0338   CL 102 02/25/2020 0338   CO2 28 02/25/2020 0338   GLUCOSE 124 (H) 02/25/2020 0338   BUN 22 (H) 02/25/2020 0338   CREATININE 0.95 02/25/2020 0338   CALCIUM 8.5 (L) 02/25/2020 0338   PROT 8.3 (H) 02/24/2020 1851   ALBUMIN 3.4 (L) 02/24/2020 1851   AST 38 02/24/2020 1851   ALT 40 02/24/2020 1851   ALKPHOS 58 02/24/2020 1851   BILITOT 1.5 (H) 02/24/2020 1851   GFRNONAA >60 02/25/2020 0338   GFRAA >60 08/14/2019 2021   Lipase  No results found for: LIPASE     Studies/Results: No results found.  Anti-infectives: Anti-infectives (From admission, onward)   Start     Dose/Rate Route Frequency Ordered Stop   02/27/20 1630  doxycycline (VIBRA-TABS) tablet 100 mg        100 mg Oral Every 12 hours 02/27/20 1533     02/24/20 2215  piperacillin-tazobactam (ZOSYN) IVPB 3.375 g        3.375 g 12.5 mL/hr over 240 Minutes Intravenous Every 8 hours 02/24/20  2214         Assessment/Plan BPH Incarcerated  Supralevator abscess with extension into extraperitoneal pelvis -IR drain in place with good drainage noted -tolerating diet -on IV zosyn  Cx: MRSA: Doxycycline, Zosyn d/c'd 12/5 - abscess most likely arising from L buttock abscess -WBC normal  FEN:  regular diet/SLIV ID:  Zosyn 12/1>> Doxy 12/4 SVT:  SCD/ Lovenox    LOS: 3 days    [name] , MD [facility] 02/28/2020, 8:57 AM Please see Amion for pager number during day hours 7:00am-4:30pm or 7:00am -11:30am on weekends

## 2020-02-29 MED ORDER — HYDROMORPHONE HCL 1 MG/ML IJ SOLN
0.5000 mg | Freq: Three times a day (TID) | INTRAMUSCULAR | Status: DC | PRN
  Administered 2020-02-29 – 2020-03-01 (×4): 0.5 mg via INTRAVENOUS
  Filled 2020-02-29 (×4): qty 0.5

## 2020-02-29 MED ORDER — IBUPROFEN 400 MG PO TABS
400.0000 mg | ORAL_TABLET | Freq: Three times a day (TID) | ORAL | Status: DC
  Administered 2020-02-29 (×3): 400 mg via ORAL
  Filled 2020-02-29 (×4): qty 1

## 2020-02-29 MED ORDER — ZOLPIDEM TARTRATE 10 MG PO TABS
10.0000 mg | ORAL_TABLET | Freq: Every evening | ORAL | Status: DC | PRN
  Administered 2020-02-29: 10 mg via ORAL
  Filled 2020-02-29: qty 1

## 2020-02-29 MED ORDER — METHOCARBAMOL 500 MG PO TABS
1000.0000 mg | ORAL_TABLET | Freq: Four times a day (QID) | ORAL | Status: DC
  Administered 2020-02-29 (×4): 1000 mg via ORAL
  Filled 2020-02-29 (×5): qty 2

## 2020-02-29 MED ORDER — OXYCODONE HCL 5 MG PO TABS
5.0000 mg | ORAL_TABLET | ORAL | Status: DC | PRN
  Administered 2020-02-29 – 2020-03-01 (×4): 5 mg via ORAL
  Filled 2020-02-29 (×4): qty 1

## 2020-02-29 NOTE — Progress Notes (Signed)
       Subjective: Tolerating a regular diet.  Still with abd pain.  Mild nausea this AM, no vomiting. States the tramadol doesn't seem to help and makes him "jittery". Still requiring IV pain meds TID. Last BM 2 days ago.  Objective: Vital signs in last 24 hours: Temp:  [97.9 F (36.6 C)-98.8 F (37.1 C)] 98.2 F (36.8 C) (12/06 0542) Pulse Rate:  [88-95] 88 (12/06 0542) Resp:  [16-20] 17 (12/06 0542) BP: (122-123)/(64-79) 122/79 (12/06 0542) SpO2:  [93 %-96 %] 96 % (12/06 0542) Last BM Date: 02/27/20  Intake/Output from previous day: 12/05 0701 - 12/06 0700 In: 1690 [P.O.:1680] Out: 882 [Urine:830; Drains:52] Intake/Output this shift: Total I/O In: -  Out: 100 [Urine:100]  PE: Heart: regular Lungs: CTAB Abd: drain in anterior abdomen with tan purulent drainage in place (52 cc/24h).  Appropriately tender, soft, ND GU: left buttock without cellulitis, there is ~ 2 cm mild induration of the left buttock in the inferior gluteal cleft. No fluctuance.  Lab Results:  No results for input(s): WBC, HGB, HCT, PLT in the last 72 hours. BMET No results for input(s): NA, K, CL, CO2, GLUCOSE, BUN, CREATININE, CALCIUM in the last 72 hours. PT/INR No results for input(s): LABPROT, INR in the last 72 hours. CMP     Component Value Date/Time   NA 137 02/25/2020 0338   K 4.1 02/25/2020 0338   CL 102 02/25/2020 0338   CO2 28 02/25/2020 0338   GLUCOSE 124 (H) 02/25/2020 0338   BUN 22 (H) 02/25/2020 0338   CREATININE 0.95 02/25/2020 0338   CALCIUM 8.5 (L) 02/25/2020 0338   PROT 8.3 (H) 02/24/2020 1851   ALBUMIN 3.4 (L) 02/24/2020 1851   AST 38 02/24/2020 1851   ALT 40 02/24/2020 1851   ALKPHOS 58 02/24/2020 1851   BILITOT 1.5 (H) 02/24/2020 1851   GFRNONAA >60 02/25/2020 0338   GFRAA >60 08/14/2019 2021   Lipase  No results found for: LIPASE     Studies/Results: No results found.  Anti-infectives: Anti-infectives (From admission, onward)   Start     Dose/Rate Route  Frequency Ordered Stop   02/28/20 1630  amoxicillin (AMOXIL) capsule 500 mg        500 mg Oral 3 times daily 02/28/20 1532     02/27/20 1630  doxycycline (VIBRA-TABS) tablet 100 mg        100 mg Oral Every 12 hours 02/27/20 1533     02/24/20 2215  piperacillin-tazobactam (ZOSYN) IVPB 3.375 g  Status:  Discontinued        3.375 g 12.5 mL/hr over 240 Minutes Intravenous Every 8 hours 02/24/20 2214 02/28/20 0900       Assessment/Plan BPH Incarcerated  Supralevator abscess with extension into extraperitoneal pelvis -IR drain in place with good drainage noted -tolerating diet -on PO abx. Cx: MRSA: Doxycycline, Zosyn d/c'd 12/5 - abscess most likely arising from L buttock abscess -WBC normal  FEN:  regular diet/SLIV ID:  Zosyn 12/1>> Doxy 12/4 SVT:  SCD/ Lovenox  Dispo: adjust PO pain control (schedule ibuprofen and robaxin, switch PO tramadol to PO oxycodone PRN) , monitor nausea. Anticipate medical readiness for discharge in 24-48 hours. Will schedule follow up with colorectal surgeon. Will also need outpatient IR follow up.    LOS: 4 days    Adam Phenix , MD [facility] Surgery 02/29/2020, 8:37 AM Please see Amion for pager number during day hours 7:00am-4:30pm or 7:00am -11:30am on weekends

## 2020-02-29 NOTE — Progress Notes (Signed)
Pt c/o insomnia and wanted to  See if he could get something to help him sleep. Paged Dr. Donell Beers. New orders for ambien 10mg  QHS PRN.

## 2020-02-29 NOTE — Progress Notes (Signed)
Referring Physician(s): Allen,S  Supervising Physician: Dr. Fredia Sorrow  Patient Status:  [facility] - In-pt  Chief Complaint:  Abdominal/pelvic pain/abscess  Subjective: Patient feels okay. Tolerating reg diet. No new specific complaints  Allergies: Patient has no known allergies.  Medications:  Current Facility-Administered Medications:  .  acetaminophen (TYLENOL) tablet 1,000 mg, 1,000 mg, Oral, TID, Karie Soda, MD, 1,000 mg at 02/29/20 0907 .  albuterol (VENTOLIN HFA) 108 (90 Base) MCG/ACT inhaler 2 puff, 2 puff, Inhalation, Q6H PRN, Sophronia Simas L, MD .  amoxicillin (AMOXIL) capsule 500 mg, 500 mg, Oral, TID, Romie Levee, MD, 500 mg at 02/29/20 0907 .  doxycycline (VIBRA-TABS) tablet 100 mg, 100 mg, Oral, Q12H, Darnell Level, MD, 100 mg at 02/29/20 0908 .  enoxaparin (LOVENOX) injection 40 mg, 40 mg, Subcutaneous, Daily, Barnetta Chapel, PA-C, 40 mg at 02/29/20 9449 .  feeding supplement (ENSURE SURGERY) liquid 237 mL, 237 mL, Oral, BID BM, Karie Soda, MD, 237 mL at 02/29/20 1018 .  gabapentin (NEURONTIN) capsule 300 mg, 300 mg, Oral, BID, Karie Soda, MD, 300 mg at 02/29/20 0907 .  HYDROmorphone (DILAUDID) injection 0.5 mg, 0.5 mg, Intravenous, Q8H PRN, Adam Phenix, PA-C, 0.5 mg at 02/29/20 1157 .  ibuprofen (ADVIL) tablet 400 mg, 400 mg, Oral, TID, Adam Phenix, PA-C, 400 mg at 02/29/20 0907 .  lip balm (CARMEX) ointment 1 application, 1 application, Topical, BID, Karie Soda, MD, 1 application at 02/29/20 0946 .  magic mouthwash, 15 mL, Oral, QID PRN, Karie Soda, MD .  menthol-cetylpyridinium (CEPACOL) lozenge 3 mg, 1 lozenge, Oral, PRN, Karie Soda, MD .  methocarbamol (ROBAXIN) tablet 1,000 mg, 1,000 mg, Oral, QID, Simaan, Elizabeth S, PA-C, 1,000 mg at 02/29/20 1018 .  ondansetron (ZOFRAN-ODT) disintegrating tablet 4 mg, 4 mg, Oral, Q6H PRN **OR** ondansetron (ZOFRAN) injection 4 mg, 4 mg, Intravenous, Q6H PRN, Fritzi Mandes, MD, 4 mg at  02/25/20 2330 .  oxyCODONE (Oxy IR/ROXICODONE) immediate release tablet 5 mg, 5 mg, Oral, Q4H PRN, Adam Phenix, PA-C, 5 mg at 02/29/20 0908 .  phenol (CHLORASEPTIC) mouth spray 2 spray, 2 spray, Mouth/Throat, PRN, Gross, Viviann Spare, MD .  polyethylene glycol (MIRALAX / GLYCOLAX) packet 17 g, 17 g, Oral, BID, Karie Soda, MD, 17 g at 02/29/20 0907 .  prochlorperazine (COMPAZINE) injection 5-10 mg, 5-10 mg, Intravenous, Q4H PRN, Karie Soda, MD .  sodium chloride flush (NS) 0.9 % injection 5 mL, 5 mL, Intracatheter, Q8H, Sterling Big, MD, 5 mL at 02/29/20 0601    Vital Signs: BP 122/79 (BP Location: Left Arm)   Pulse 88   Temp 98.2 F (36.8 C) (Oral)   Resp 17   Ht 5\' 9"  (1.753 m)   Wt 66.1 kg   SpO2 96%   BMI 21.52 kg/m   Physical Exam awake, alert.  abdominal drain intact, insertion site okay,, mild- mod tender to palpation Output now seems to be more serous but some purulent stranding.  Imaging: CT IMAGE GUIDED DRAINAGE BY PERCUTANEOUS CATHETER  Result Date: 02/25/2020 INDICATION: 49 year old male with progressive perirectal abscess. He presents for CT-guided drain placement. EXAM: CT-guided drain placement MEDICATIONS: The patient is currently admitted to the hospital and receiving intravenous antibiotics. The antibiotics were administered within an appropriate time frame prior to the initiation of the procedure. ANESTHESIA/SEDATION: Fentanyl 100 mcg IV; Versed 2 mg IV Moderate Sedation Time:  17 minutes The patient was continuously monitored during the procedure by the interventional radiology nurse under my direct supervision. COMPLICATIONS: None immediate. PROCEDURE:  Informed written consent was obtained from the patient after a thorough discussion of the procedural risks, benefits and alternatives. All questions were addressed. Maximal Sterile Barrier Technique was utilized including caps, mask, sterile gowns, sterile gloves, sterile drape, hand hygiene and skin  antiseptic. A timeout was performed prior to the initiation of the procedure. A planning axial CT scan was performed. The fluid collection extending superiorly from the left perirectal space was successfully identified. An anterior approach in the left medial inguinal recess was selected. The skin was marked before being sterilely prepped and draped in the standard fashion using chlorhexidine skin prep. Local anesthesia was attained by infiltration with 1% lidocaine. A small dermatotomy was made. Under intermittent CT guidance, an 18 gauge trocar needle was advanced into the fluid collection. A 0.035 wire was then coiled in the fluid collection. The skin tract was dilated to 12 [place]. A Cook 12 [place] all-purpose drainage catheter was then advanced over the wire and formed. Aspiration yields approximately 20 mL of thick purulent fluid. A sample was sent for Gram stain and culture. The drainage catheter was then flushed, connected to JP bulb suction and secured to the skin with 0 Prolene suture. CT imaging demonstrates a well-positioned drainage catheter without evidence of complication. The patient tolerated the procedure well. IMPRESSION: Successful placement of 12 French percutaneous drainage catheter into the left perirectal abscess. Aspirated purulent fluid sent for Gram stain and culture. Electronically Signed   By: Malachy Moan M.D.   On: 02/25/2020 16:21    Labs:  CBC: Recent Labs    08/14/19 2021 02/24/20 1851 02/25/20 0338  WBC 7.3 8.3 7.3  HGB 13.4 12.0* 9.7*  HCT 38.6* 35.3* 29.5*  PLT 238 674* 478*    COAGS: Recent Labs    02/25/20 0338  INR 1.1    BMP: Recent Labs    08/14/19 2021 02/24/20 1851 02/25/20 0338  NA 137 137 137  K 4.1 5.7* 4.1  CL 101 100 102  CO2 28 25 28   GLUCOSE 101* 91 124*  BUN 25* 22* 22*  CALCIUM 9.2 9.0 8.5*  CREATININE 0.90 0.97 0.95  GFRNONAA >60 >60 >60  GFRAA >60  --   --     LIVER FUNCTION TESTS: Recent Labs    08/14/19 2021  02/24/20 1851  BILITOT 0.6 1.5*  AST 15 38  ALT 16 40  ALKPHOS 85 58  PROT 7.8 8.3*  ALBUMIN 4.8 3.4*    Assessment and Plan: Patient with history of progressive perirectal abscess, status post drain placement on 12/2;  MRSA+ continue with drain irrigation, output monitoring, lab checks; once output minimal or if clinical status worsens obtain follow-up CT scan. Per [facility] notes, anticipated DC in next few days  Electronically Signed: 14/2, PA-C 02/29/2020, 1:37 PM   I spent a total of 15 minutes at the the patient's bedside AND on the patient's hospital floor or unit, greater than 50% of which was counseling/coordinating care for left abdominal/pelvic abscess drain

## 2020-03-01 ENCOUNTER — Other Ambulatory Visit: Payer: Self-pay | Admitting: Urology

## 2020-03-01 ENCOUNTER — Other Ambulatory Visit: Payer: Self-pay | Admitting: Radiology

## 2020-03-01 ENCOUNTER — Other Ambulatory Visit: Payer: Self-pay | Admitting: Surgery

## 2020-03-01 DIAGNOSIS — K615 Supralevator abscess: Secondary | ICD-10-CM

## 2020-03-01 LAB — CBC
HCT: 31.7 % — ABNORMAL LOW (ref 39.0–52.0)
Hemoglobin: 10.5 g/dL — ABNORMAL LOW (ref 13.0–17.0)
MCH: 33.4 pg (ref 26.0–34.0)
MCHC: 33.1 g/dL (ref 30.0–36.0)
MCV: 101 fL — ABNORMAL HIGH (ref 80.0–100.0)
Platelets: 303 10*3/uL (ref 150–400)
RBC: 3.14 MIL/uL — ABNORMAL LOW (ref 4.22–5.81)
RDW: 13.8 % (ref 11.5–15.5)
WBC: 7 10*3/uL (ref 4.0–10.5)
nRBC: 0 % (ref 0.0–0.2)

## 2020-03-01 LAB — AEROBIC/ANAEROBIC CULTURE W GRAM STAIN (SURGICAL/DEEP WOUND)

## 2020-03-01 LAB — BASIC METABOLIC PANEL
Anion gap: 9 (ref 5–15)
BUN: 27 mg/dL — ABNORMAL HIGH (ref 6–20)
CO2: 26 mmol/L (ref 22–32)
Calcium: 8.3 mg/dL — ABNORMAL LOW (ref 8.9–10.3)
Chloride: 101 mmol/L (ref 98–111)
Creatinine, Ser: 0.72 mg/dL (ref 0.61–1.24)
GFR, Estimated: >60 mL/min (ref >60)
Glucose, Bld: 107 mg/dL — ABNORMAL HIGH (ref 70–99)
Potassium: 3.8 mmol/L (ref 3.5–5.1)
Sodium: 136 mmol/L (ref 135–145)

## 2020-03-01 MED ORDER — METHOCARBAMOL 500 MG PO TABS
1000.0000 mg | ORAL_TABLET | Freq: Four times a day (QID) | ORAL | 1 refills | Status: DC
Start: 2020-03-01 — End: 2020-08-24

## 2020-03-01 MED ORDER — SODIUM CHLORIDE 0.9% FLUSH
5.0000 mL | Freq: Every day | INTRAVENOUS | 0 refills | Status: DC
Start: 2020-03-01 — End: 2020-08-24

## 2020-03-01 MED ORDER — AMOXICILLIN 500 MG PO CAPS
500.0000 mg | ORAL_CAPSULE | Freq: Three times a day (TID) | ORAL | 0 refills | Status: DC
Start: 2020-03-01 — End: 2020-08-24

## 2020-03-01 MED ORDER — ONDANSETRON 4 MG PO TBDP: 4.0000 mg | ORAL_TABLET | Freq: Four times a day (QID) | ORAL | 0 refills | Status: DC | PRN

## 2020-03-01 MED ORDER — IBUPROFEN 400 MG PO TABS
400.0000 mg | ORAL_TABLET | Freq: Three times a day (TID) | ORAL | Status: DC | PRN
Start: 2020-03-01 — End: 2020-08-24

## 2020-03-01 MED ORDER — PROSOURCE PLUS PO LIQD: 30.0000 mL | Freq: Every day | ORAL | Status: DC

## 2020-03-01 MED ORDER — GABAPENTIN 300 MG PO CAPS: 300.0000 mg | ORAL_CAPSULE | Freq: Two times a day (BID) | ORAL | 0 refills | Status: DC

## 2020-03-01 MED ORDER — DOXYCYCLINE HYCLATE 100 MG PO TABS
100.0000 mg | ORAL_TABLET | Freq: Two times a day (BID) | ORAL | 0 refills | Status: DC
Start: 2020-03-01 — End: 2020-08-24

## 2020-03-01 MED ORDER — OXYCODONE HCL 5 MG PO TABS
5.0000 mg | ORAL_TABLET | ORAL | Status: DC | PRN
  Administered 2020-03-01: 10 mg via ORAL
  Filled 2020-03-01: qty 2

## 2020-03-01 MED ORDER — OXYCODONE HCL 5 MG PO TABS
5.0000 mg | ORAL_TABLET | ORAL | 0 refills | Status: DC | PRN
Start: 2020-03-01 — End: 2020-08-24

## 2020-03-01 NOTE — Progress Notes (Signed)
Progress Note     Subjective: Patient reports increased pain in LLE and abdomen today. Reports some nausea and chills as well. Afebrile.   Objective: Vital signs in last 24 hours: Temp:  [98.1 F (36.7 C)-98.2 F (36.8 C)] 98.1 F (36.7 C) (12/07 0529) Pulse Rate:  [84-109] 94 (12/07 0529) Resp:  [16-17] 16 (12/07 0529) BP: (128-139)/(62-74) 139/74 (12/07 0529) SpO2:  [95 %-97 %] 95 % (12/07 0529) Last BM Date: 02/27/20  Intake/Output from previous day: 12/06 0701 - 12/07 0700 In: 1095 [P.O.:1080] Out: 259 [Urine:220; Drains:39] Intake/Output this shift: Total I/O In: 240 [P.O.:240] Out: 200 [Urine:200]  PE: General: pleasant, WD, thin male who is laying in bed in NAD HEENT: Sclera are anicteric.  PERRL.  Ears and nose without any masses or lesions.  Mouth is pink and moist Heart: regular, rate, and rhythm.  Lungs: CTAB, no wheezes, rhonchi, or rales noted.  Respiratory effort nonlabored Abd: soft, mildly ttp in LLQ, ND, +BS, drain present with purulent appearing fluid GU: mild erythema and induration of L perineum that is non-tender  MS: all 4 extremities are symmetrical with no cyanosis, clubbing, or edema. Skin: warm and dry with no masses, lesions, or rashes Neuro: Cranial nerves 2-12 grossly intact, sensation is normal throughout Psych: A&Ox3 with an appropriate affect.    Lab Results:  No results for input(s): WBC, HGB, HCT, PLT in the last 72 hours. BMET No results for input(s): NA, K, CL, CO2, GLUCOSE, BUN, CREATININE, CALCIUM in the last 72 hours. PT/INR No results for input(s): LABPROT, INR in the last 72 hours. CMP     Component Value Date/Time   NA 137 02/25/2020 0338   K 4.1 02/25/2020 0338   CL 102 02/25/2020 0338   CO2 28 02/25/2020 0338   GLUCOSE 124 (H) 02/25/2020 0338   BUN 22 (H) 02/25/2020 0338   CREATININE 0.95 02/25/2020 0338   CALCIUM 8.5 (L) 02/25/2020 0338   PROT 8.3 (H) 02/24/2020 1851   ALBUMIN 3.4 (L) 02/24/2020 1851   AST 38  02/24/2020 1851   ALT 40 02/24/2020 1851   ALKPHOS 58 02/24/2020 1851   BILITOT 1.5 (H) 02/24/2020 1851   GFRNONAA >60 02/25/2020 0338   GFRAA >60 08/14/2019 2021   Lipase  No results found for: LIPASE     Studies/Results: No results found.  Anti-infectives: Anti-infectives (From admission, onward)   Start     Dose/Rate Route Frequency Ordered Stop   02/28/20 1630  amoxicillin (AMOXIL) capsule 500 mg        500 mg Oral 3 times daily 02/28/20 1532     02/27/20 1630  doxycycline (VIBRA-TABS) tablet 100 mg        100 mg Oral Every 12 hours 02/27/20 1533     02/24/20 2215  piperacillin-tazobactam (ZOSYN) IVPB 3.375 g  Status:  Discontinued        3.375 g 12.5 mL/hr over 240 Minutes Intravenous Every 8 hours 02/24/20 2214 02/28/20 0900       Assessment/Plan BPH Incarcerated  Supralevator abscess with extension into extraperitoneal pelvis - IR drain in place with good drainage noted - on PO abx. Cx: MRSA: Doxycycline, Zosyn d/c'd 12/5 - abscess most likely arising from L buttock abscess - repeat labs today with complaints of increased pain and nausea   FEN: regular diet/SLIV ID: Zosyn 12/1>12/5;PO Doxy 12/4>> SVT: SCD/ Lovenox   Dispo: repeat labs this AM, if WBC elevated will likely need repeat CT scan. Working on figuring out if  jail will be able to care for drain.   LOS: 5 days    Juliet Rude , [facility] Surgery 03/01/2020, 9:49 AM Please see Amion for pager number during day hours 7:00am-4:30pm

## 2020-03-01 NOTE — Discharge Summary (Signed)
[facility] Discharge Summary   Patient ID: [name] MRN: [id number] DOB/AGE: Dec 15, 1970 49 y.o.  Admit date: 02/24/2020 Discharge date: 03/01/2020  Admitting Diagnosis: Supralevator abscess  Discharge Diagnosis Patient Active Problem List   Diagnosis Date Noted  . Supralevator abscess 02/24/2020    Consultants Interventional radiology   Imaging: No results found.  Procedures Dr. Archer Asa (02/25/20) - CT guided drain placement  Hospital Course:  Patient is a 49 year old male who presented to [facility] with perianal drainage for 2 weeks.  Workup showed perianal abscess with extension into pelvis and abdominal wall.  Patient was admitted, IR was consulted and patient underwent procedure listed above.  Tolerated procedure well and was transferred to the floor.  Diet was advanced as tolerated. WBC normalized and antibiotics were narrowed according to drain cultures.  On 03/01/20, the patient was voiding well, tolerating diet, ambulating well, pain well controlled, vital signs stable, incisions c/d/i and felt stable for discharge home.  Patient will follow up with IR drain clinic in 1 week and drain is to be flushed once daily with 5 cc of sterile saline. Patient will have follow up with colorectal surgeon as well.   I or a member of my team have reviewed this patient in the Controlled Substance Database.   Allergies as of 03/01/2020   No Known Allergies     Medication List    STOP taking these medications   tamsulosin 0.4 MG Caps capsule Commonly known as: FLOMAX     TAKE these medications   acetaminophen 500 MG tablet Commonly known as: TYLENOL Take 500-1,000 mg by mouth every 6 (six) hours as needed for mild pain.   amoxicillin 500 MG capsule Commonly known as: AMOXIL Take 1 capsule (500 mg total) by mouth 3 (three) times daily for 5 days.   doxycycline 100 MG tablet Commonly known as: VIBRA-TABS Take 1 tablet (100 mg total) by mouth every 12  (twelve) hours for 5 days.   gabapentin 300 MG capsule Commonly known as: NEURONTIN Take 1 capsule (300 mg total) by mouth 2 (two) times daily.   ibuprofen 400 MG tablet Commonly known as: ADVIL Take 1 tablet (400 mg total) by mouth every 8 (eight) hours as needed.   loperamide 2 MG tablet Commonly known as: IMODIUM A-D Take 2 mg by mouth 4 (four) times daily as needed for diarrhea or loose stools.   methocarbamol 500 MG tablet Commonly known as: ROBAXIN Take 2 tablets (1,000 mg total) by mouth 4 (four) times daily.   ondansetron 4 MG disintegrating tablet Commonly known as: ZOFRAN-ODT Take 1 tablet (4 mg total) by mouth every 6 (six) hours as needed for nausea.   oxyCODONE 5 MG immediate release tablet Commonly known as: Oxy IR/ROXICODONE Take 1-2 tablets (5-10 mg total) by mouth every 4 (four) hours as needed for moderate pain or severe pain.   sodium chloride flush 0.9 % Soln Commonly known as: NS 5 mLs by Intracatheter route daily.         Follow-up Information    Andria Meuse, MD. Go on 03/30/2020.   Specialty: General Surgery Why: at 2:30 PM for follow up from recent hospitalization and drainage of abscess. please arrive 30 minutes early to get checked in and fill out any necessary paperwork.  Contact information: [address] [place] [address] [phone]        Sterling Big, MD Follow up in 1 week(s).   Specialties: Interventional Radiology, Radiology Why: Follow  up for drain study.  Contact information: [address] [place] [address] [phone]               Signed: Juliet Rude , [facility] 03/01/2020, 10:57 AM Please see Amion for pager number during day hours 7:00am-4:30pm

## 2020-03-01 NOTE — Discharge Instructions (Signed)
Percutaneous Abscess Drain, Care After This sheet gives you information about how to care for yourself after your procedure. Your health care provider may also give you more specific instructions. If you have problems or questions, contact your health care provider. What can I expect after the procedure? After your procedure, it is common to have:  A small amount of bruising and discomfort in the area where the drainage tube (catheter) was placed.  Sleepiness and fatigue. This should go away after the medicines you were given have worn off. Follow these instructions at home: Incision care  Follow instructions from your health care provider about how to take care of your incision. Make sure you: ? Wash your hands with soap and water before you change your bandage (dressing). If soap and water are not available, use hand sanitizer. ? Change your dressing as told by your health care provider. ? Leave stitches (sutures), skin glue, or adhesive strips in place. These skin closures may need to stay in place for 2 weeks or longer. If adhesive strip edges start to loosen and curl up, you may trim the loose edges. Do not remove adhesive strips completely unless your health care provider tells you to do that.  Check your incision area every day for signs of infection. Check for: ? More redness, swelling, or pain. ? More fluid or blood. ? Warmth. ? Pus or a bad smell. ? Fluid leaking from around your catheter (instead of fluid draining through your catheter). Catheter care   Follow instructions from your health care provider about emptying and cleaning your catheter and collection bag. You may need to clean the catheter every day so it does not clog.  If directed, write down the following information every time you empty your bag: ? The date and time. ? The amount of drainage. General instructions  Rest at home for 1-2 days after your procedure. Return to your normal activities as told by your  health care provider.  Do not take baths, swim, or use a hot tub for 24 hours after your procedure, or until your health care provider says that this is okay.  Take over-the-counter and prescription medicines only as told by your health care provider.  Keep all follow-up visits as told by your health care provider. This is important. Contact a health care provider if:  You have less than 10 mL of drainage a day for 2-3 days in a row, or as directed by your health care provider.  You have more redness, swelling, or pain around your incision area.  You have more fluid or blood coming from your incision area.  Your incision area feels warm to the touch.  You have pus or a bad smell coming from your incision area.  You have fluid leaking from around your catheter (instead of through your catheter).  You have a fever or chills.  You have pain that does not get better with medicine. Get help right away if:  Your catheter comes out.  You suddenly stop having drainage from your catheter.  You suddenly have blood in the fluid that is draining from your catheter.  You become dizzy or you faint.  You develop a rash.  You have nausea or vomiting.  You have difficulty breathing or you feel short of breath.  You develop chest pain.  You have problems with your speech or vision.  You have trouble balancing or moving your arms or legs. Summary  It is common to have a small   amount of bruising and discomfort in the area where the drainage tube (catheter) was placed.  You may be directed to record the amount of drainage from the bag every time you empty it.  Follow instructions from your health care provider about emptying and cleaning your catheter and collection bag. This information is not intended to replace advice given to you by your health care provider. Make sure you discuss any questions you have with your health care provider. Document Revised: 02/22/2017 Document  Reviewed: 02/02/2016 Elsevier Patient Education  2020 [employer]   Surgical Drain Record Empty your surgical drain as told by your health care provider. Use this form to write down the amount of fluid that has collected in the drainage container. Bring this form with you to your follow-up visits.   Date __________ Time __________ Amount __________ Date __________ Time __________ Amount __________ Date __________ Time __________ Amount __________ Date __________ Time __________ Amount __________ Date __________ Time __________ Amount __________ Date __________ Time __________ Amount __________ Date __________ Time __________ Amount __________ Date __________ Time __________ Amount __________ Date __________ Time __________ Amount __________ Date __________ Time __________ Amount __________ Date __________ Time __________ Amount __________ Date __________ Time __________ Amount __________ Date __________ Time __________ Amount __________ Date __________ Time __________ Amount __________ Date __________ Time __________ Amount __________ Date __________ Time __________ Amount __________ Date __________ Time __________ Amount __________ Date __________ Time __________ Amount __________ Date __________ Time __________ Amount __________ Date __________ Time __________ Amount __________ Date __________ Time __________ Amount __________ Date __________ Time __________ Amount __________ Date __________ Time __________ Amount __________ Date __________ Time __________ Amount __________ Date __________ Time __________ Amount __________ Date __________ Time __________ Amount __________ Date __________ Time __________ Amount __________ Date __________ Time __________ Amount __________ Date __________ Time __________ Amount __________ Date __________ Time __________ Amount __________ Date __________ Time __________ Amount __________ Date __________ Time __________ Amount __________ Date  __________ Time __________ Amount __________ Date __________ Time __________ Amount __________ Date __________ Time __________ Amount __________ Date __________ Time __________ Amount __________ Date __________ Time __________ Amount __________ Date __________ Time __________ Amount __________ Date __________ Time __________ Amount __________ Date __________ Time __________ Amount __________ Date __________ Time __________ Amount __________ Date __________ Time __________ Amount __________ This information is not intended to replace advice given to you by your health care provider. Make sure you discuss any questions you have with your health care provider. Document Revised: 12/17/2016 Document Reviewed: 12/17/2016 Elsevier Patient Education  2020 [employer].

## 2020-03-01 NOTE — Progress Notes (Signed)
Referring Physician(s): Adin Hector PA  Supervising Physician: Ruel Favors  Patient Status:  [facility] - In-pt  Chief Complaint: Perirectal abscess s/p abscess drain placement on 12.2.21 by Dr. Archer Asa   Subjective:  Patient reporting increase amount of pain in the last 24 hours. Discussed drain care and management with patient. Patient is concerned about "what would happen if someone rips it out"  Allergies: Patient has no known allergies.  Medications: Prior to Admission medications   Medication Sig Start Date End Date Taking? Authorizing Provider  acetaminophen (TYLENOL) 500 MG tablet Take 500-1,000 mg by mouth every 6 (six) hours as needed for mild pain.   Yes [provider]  loperamide (IMODIUM A-D) 2 MG tablet Take 2 mg by mouth 4 (four) times daily as needed for diarrhea or loose stools.   Yes [provider]  amoxicillin (AMOXIL) 500 MG capsule Take 1 capsule (500 mg total) by mouth 3 (three) times daily for 5 days. 03/01/20 03/06/20  Juliet Rude, PA-C  doxycycline (VIBRA-TABS) 100 MG tablet Take 1 tablet (100 mg total) by mouth every 12 (twelve) hours for 5 days. 03/01/20 03/06/20  Juliet Rude, PA-C  gabapentin (NEURONTIN) 300 MG capsule Take 1 capsule (300 mg total) by mouth 2 (two) times daily. 03/01/20 03/31/20  Juliet Rude, PA-C  ibuprofen (ADVIL) 400 MG tablet Take 1 tablet (400 mg total) by mouth every 8 (eight) hours as needed. 03/01/20   Juliet Rude, PA-C  methocarbamol (ROBAXIN) 500 MG tablet Take 2 tablets (1,000 mg total) by mouth 4 (four) times daily. 03/01/20   Juliet Rude, PA-C  ondansetron (ZOFRAN-ODT) 4 MG disintegrating tablet Take 1 tablet (4 mg total) by mouth every 6 (six) hours as needed for nausea. 03/01/20   Juliet Rude, PA-C  oxyCODONE (OXY IR/ROXICODONE) 5 MG immediate release tablet Take 1-2 tablets (5-10 mg total) by mouth every 4 (four) hours as needed for moderate pain or severe pain. 03/01/20   Juliet Rude, PA-C  sodium chloride flush (NS) 0.9 % SOLN 5 mLs by Intracatheter route daily. 03/01/20   Juliet Rude, PA-C  tamsulosin (FLOMAX) 0.4 MG CAPS capsule Take 1 capsule (0.4 mg total) by mouth daily. Patient not taking: Reported on 02/24/2020 07/10/17   Riki Altes, MD     Vital Signs: BP 139/74 (BP Location: Left Arm)   Pulse 94   Temp 98.1 F (36.7 C) (Oral)   Resp 16   Ht 5\' 9"  (1.753 m)   Wt 145 lb 11.2 oz (66.1 kg)   SpO2 95%   BMI 21.52 kg/m   Physical Exam Vitals and nursing note reviewed.  Constitutional:      Appearance: He is well-developed.  HENT:     Head: Normocephalic.  Pulmonary:     Effort: Pulmonary effort is normal.  Abdominal:     Comments: Positive perirectal  to  gravity bag.   Musculoskeletal:        General: Normal range of motion.     Cervical back: Normal range of motion.  Skin:    General: Skin is dry.  Neurological:     Mental Status: He is alert and oriented to person, place, and time.     Imaging: No results found.  Labs:  CBC: Recent Labs    08/14/19 2021 02/24/20 1851 02/25/20 0338 03/01/20 0953  WBC 7.3 8.3 7.3 7.0  HGB 13.4 12.0* 9.7* 10.5*  HCT 38.6* 35.3* 29.5* 31.7*  PLT 238  674* 478* 303    COAGS: Recent Labs    02/25/20 0338  INR 1.1    BMP: Recent Labs    08/14/19 2021 02/24/20 1851 02/25/20 0338 03/01/20 0953  NA 137 137 137 136  K 4.1 5.7* 4.1 3.8  CL 101 100 102 101  CO2 28 25 28 26   GLUCOSE 101* 91 124* 107*  BUN 25* 22* 22* 27*  CALCIUM 9.2 9.0 8.5* 8.3*  CREATININE 0.90 0.97 0.95 0.72  GFRNONAA >60 >60 >60 >60  GFRAA >60  --   --   --     LIVER FUNCTION TESTS: Recent Labs    08/14/19 2021 02/24/20 1851  BILITOT 0.6 1.5*  AST 15 38  ALT 16 40  ALKPHOS 85 58  PROT 7.8 8.3*  ALBUMIN 4.8 3.4*    Assessment and Plan:  49 y.o. male inpatient. presented to the ED at [facility] with persistent perianal drainage after I&D and 2 rounds of antibiotics with abdominal pain. IR placed  a perirectal abscess drain on 12.2.21 by Dr. 14.2.21. Culture positive for MRSA. WBC WNL. patient is a febrile. Per epic output is 39 ml, 52 ml, 117 ml.  If patient is to be discharged, below are discharge instructions: - Flush each drain once daily with 5-10 cc NS flush (patient will need an order for flushes upon discharge). RN aware to teach patient how to manage drains at home. - Record output from each drain once daily. - Follow-up at drain clinic 10-14 days after discharge for CT/possible drain injection (assess for possible drain removal)- order placed to facilitate this.  IR scheduler will call with appointment date and time. Given contact information for medical team. Please call [phone] with any questions or concerns    Electronically Signed: [phone], NP 03/01/2020, 11:52 AM   I spent a total of 15 Minutes at the patient's bedside AND on the patient's hospital floor or unit, greater than 50% of which was counseling/coordinating care for perirectal abscess drain.

## 2020-03-01 NOTE — Progress Notes (Signed)
Follow-Up Nutrition Assessment RD working remotely.  DOCUMENTATION CODES:   Not applicable  INTERVENTION:  - will order Magic Cup with dinner meals, each supplement provides 290 kcal and 9 grams of protein. - will order 30 ml Prosource Plus once/day, each supplement provides 100 kcal and 15 grams protein.   NUTRITION DIAGNOSIS:   Increased nutrient needs related to acute illness as evidenced by estimated needs.  GOAL:   Patient will meet greater than or equal to 90% of their needs  MONITOR:   PO intake, Supplement acceptance, Labs, Weight trends  ASSESSMENT:   49 year-old male with medical history of anxiety, depression, and asthma. [facility] due to persistent perianal drainage. He developed an abscess ~2 weeks PTA and had [facility]D performed at prison. Despite abx, he continued to have purulent drainage which patient reported is daily, constant. He also reported abdominal pain. CT showed perianal abscess with extension along the rectum into the pelvis and along the abdominal wall, external to the peritoneum. General surgery was consulted for evaluation.  Diet advanced from NPO to Regular on 12/2 at 1518 and he has been eating 100% at meals since that time. Patient has not been weighed since admission date of 12/2. No information documented in the edema section of flow sheet.   Per notes: - supralevator abscess with extension into extraperitoneal pelvis-- IR drain placed 12/2 - possible need for repeat CT scan     Labs reviewed; BUN: 27 mg/dl, Ca: 8.3 mg/dl. Medications reviewed; 17 g miralax BID.    Diet Order:   Diet Order            Diet regular Room service appropriate? Yes; Fluid consistency: Thin  Diet effective now                 EDUCATION NEEDS:   No education needs have been identified at this time  Skin:  Skin Assessment: Reviewed RN Assessment  Last BM:  12/4  Height:   Ht Readings from Last 1 Encounters:  02/25/20 5\' 9"   (1.753 m)    Weight:   Wt Readings from Last 1 Encounters:  02/25/20 66.1 kg    Ideal Body Weight:     BMI:  Body mass index is 21.52 kg/m.  Estimated Nutritional Needs:   Kcal:  1980-2150 kcal  Protein:  90-105 grams  Fluid:  >/= 2 L/day      14/02/21, MS, RD, LDN, CNSC Inpatient Clinical Dietitian RD pager # available in [facility]  After hours/weekend pager # available in [facility]

## 2020-03-08 ENCOUNTER — Inpatient Hospital Stay: Admission: RE | Admit: 2020-03-08 | Source: Ambulatory Visit

## 2020-03-08 ENCOUNTER — Other Ambulatory Visit

## 2020-03-08 ENCOUNTER — Other Ambulatory Visit: Payer: Self-pay | Admitting: Surgery

## 2020-03-08 DIAGNOSIS — K615 Supralevator abscess: Secondary | ICD-10-CM

## 2020-03-16 ENCOUNTER — Ambulatory Visit
Admission: RE | Admit: 2020-03-16 | Discharge: 2020-03-16 | Disposition: A | Discharge status: Home / Self Care | Source: Ambulatory Visit | Attending: Surgery | Admitting: Surgery

## 2020-03-16 ENCOUNTER — Ambulatory Visit
Admission: RE | Admit: 2020-03-16 | Discharge: 2020-03-16 | Disposition: A | Discharge status: Home / Self Care | Source: Ambulatory Visit | Attending: Radiology | Admitting: Radiology

## 2020-03-16 ENCOUNTER — Other Ambulatory Visit

## 2020-03-16 ENCOUNTER — Encounter: Payer: Self-pay | Admitting: Radiology

## 2020-03-16 DIAGNOSIS — K615 Supralevator abscess: Secondary | ICD-10-CM

## 2020-03-16 HISTORY — PX: IR RADIOLOGIST EVAL & MGMT: IMG5224

## 2020-03-16 MED ORDER — IOPAMIDOL (ISOVUE-300) INJECTION 61%
100.0000 mL | Freq: Once | INTRAVENOUS | Status: AC | PRN
  Administered 2020-03-16: 100 mL via INTRAVENOUS

## 2020-03-16 NOTE — Progress Notes (Signed)
Chief Complaint: Patient was seen in consultation today for drain follow-up.  Referring Physician(s): White, Christopher  History of Present Illness: [name] is a 49 y.o. male that was admitted to the hospital on 02/24/2020 for supralevator abscess and extension into the extraperitoneal pelvis.  Patient was found to have large abscess formations involving the left side of the pelvis that extended along the lower anterior abdominal walls bilaterally.  Patient underwent CT-guided drain placement on 02/25/2020.  Patient is currently incarcerated.  Patient has no complaints.  Patient reports minimal output from the drain.  He says that when the drain is flushed, fluid comes out around the tube and leaks onto his skin.  He reports no drainage from the left buttock.  Past Medical History:  Diagnosis Date  . Anxiety   . Asthma   . Depression     Past Surgical History:  Procedure Laterality Date  . IR RADIOLOGIST EVAL & MGMT  03/16/2020    Allergies: Patient has no known allergies.  Medications: Prior to Admission medications   Medication Sig Start Date End Date Taking? Authorizing Provider  acetaminophen (TYLENOL) 500 MG tablet Take 500-1,000 mg by mouth every 6 (six) hours as needed for mild pain.    [provider]  gabapentin (NEURONTIN) 300 MG capsule Take 1 capsule (300 mg total) by mouth 2 (two) times daily. 03/01/20 03/31/20  Juliet Rude, PA-C  ibuprofen (ADVIL) 400 MG tablet Take 1 tablet (400 mg total) by mouth every 8 (eight) hours as needed. 03/01/20   Juliet Rude, PA-C  loperamide (IMODIUM A-D) 2 MG tablet Take 2 mg by mouth 4 (four) times daily as needed for diarrhea or loose stools.    [provider]  methocarbamol (ROBAXIN) 500 MG tablet Take 2 tablets (1,000 mg total) by mouth 4 (four) times daily. 03/01/20   Juliet Rude, PA-C  ondansetron (ZOFRAN-ODT) 4 MG disintegrating tablet Take 1 tablet (4 mg total) by mouth every 6 (six) hours  as needed for nausea. 03/01/20   Juliet Rude, PA-C  oxyCODONE (OXY IR/ROXICODONE) 5 MG immediate release tablet Take 1-2 tablets (5-10 mg total) by mouth every 4 (four) hours as needed for moderate pain or severe pain. 03/01/20   Juliet Rude, PA-C  sodium chloride flush (NS) 0.9 % SOLN 5 mLs by Intracatheter route daily. 03/01/20   Juliet Rude, PA-C     Family History  Problem Relation Age of Onset  . Prostate cancer Neg Hx   . Bladder Cancer Neg Hx   . Kidney cancer Neg Hx     Social History   Socioeconomic History  . Marital status: Divorced    Spouse name: Not on file  . Number of children: Not on file  . Years of education: Not on file  . Highest education level: Not on file  Occupational History  . Not on file  Tobacco Use  . Smoking status: Current Every Day Smoker    Types: Cigarettes  . Smokeless tobacco: Never Used  Substance and Sexual Activity  . Alcohol use: Not Currently  . Drug use: Yes    Types: Methamphetamines, "Crack" cocaine  . Sexual activity: Yes    Partners: Female  Other Topics Concern  . Not on file  Social History Narrative  . Not on file   Social Determinants of Health   Financial Resource Strain: Not on file  Food Insecurity: Not on file  Transportation Needs: Not on file  Physical  Activity: Not on file  Stress: Not on file  Social Connections: Not on file     Review of Systems  Gastrointestinal:       No significant output from the drain.  Mild discomfort in the left medial buttock.    Vital Signs: BP (!) 124/36   Pulse 79   Temp 98.3 F (36.8 C)   SpO2 100%    Physical Exam:  Left anterior pelvic drain was intact.  No erythema or drainage around the tube.  Suction bulb was empty and the bulb was unable to hold suction.  Drain was completely removed without complication.  Examination of the left buttock demonstrated a small area of raised skin with redness.  No ulceration or drainage in his area.     Imaging: CT ABDOMEN PELVIS W CONTRAST  Result Date: 03/16/2020 CLINICAL DATA:  49 year old with history of left buttock abscess with extension into the pelvis and anterior abdominal wall. Patient underwent CT-guided drainage on 02/25/2020. Patient reports minimal output from the drain. Patient states that saline flushes out around the skin when the drain is flushed. EXAM: CT ABDOMEN AND PELVIS WITH CONTRAST TECHNIQUE: Multidetector CT imaging of the abdomen and pelvis was performed using the standard protocol following bolus administration of intravenous contrast. CONTRAST:  ISOVUE-300 IOPAMIDOL (ISOVUE-300) INJECTION 61% COMPARISON:  CT 02/24/2020 FINDINGS: Lower chest: Lung bases are clear. Hepatobiliary: Again noted is a tiny hypodensity in the right hepatic lobe on sequence 2 image 23 and this is likely an incidental finding. No suspicious liver lesions. Portal venous system is patent. Normal appearance of the gallbladder. No biliary dilatation. Pancreas: Unremarkable. No pancreatic ductal dilatation or surrounding inflammatory changes. Spleen: Normal in size without focal abnormality. Adrenals/Urinary Tract: Normal adrenal glands. Normal appearance of both kidneys. No hydronephrosis. No suspicious renal lesions. Normal appearance of the urinary bladder. Stomach/Bowel: Mild wall thickening in the proximal rectum may be related to under distension. No evidence for bowel obstruction. Normal appearance of stomach. Vascular/Lymphatic: Normal caliber of the abdominal aorta without significant atherosclerotic disease. Main visceral arteries are patent. No abdominal or pelvic lymph node enlargement. Reproductive: Prostate is unremarkable. Other: Percutaneous drain enters through the left anterior pelvis and terminates along the left pelvic sidewall. The large abscess collection along the left pelvic sidewall has resolved. There is no fluid or abscess around the drain. The abdominal wall collections have  resolved. There continues to be poorly defined low-density material in the medial left buttock and perianal region. This is best seen on sequence 2 image 85 and 84. Low-density material in the left buttock subcutaneous tissue is poorly defined and measures roughly 4.0 x 1.2 3.4 cm. There is no gas within this poorly defined low-density area/collection. Negative for ascites. Negative for intraperitoneal air. Musculoskeletal: Old right posterior twelfth rib fracture. Disc space narrowing at L4-L5. IMPRESSION: 1. Resolution of the abscess collections along the left pelvic sidewall and along the bilateral lower anterior abdominal walls. Left pelvic percutaneous drain is still present. 2. Residual inflammatory changes involving the medial left buttock with poorly defined low-density collections. Findings are suggestive for small residual abscesses or phlegmon collections. There is no gas associated with these low-density collections. Electronically Signed   By: Richarda Overlie M.D.   On: 03/16/2020 12:15   CT ABDOMEN PELVIS W CONTRAST  Result Date: 02/24/2020 CLINICAL DATA:  Abdominal pain. Anal rectal abscess refractory to 20 days of antibiotics. EXAM: CT ABDOMEN AND PELVIS WITH CONTRAST TECHNIQUE: Multidetector CT imaging of the abdomen and pelvis  was performed using the standard protocol following bolus administration of intravenous contrast. CONTRAST:  100mL OMNIPAQUE IOHEXOL 300 MG/ML  SOLN COMPARISON:  None. FINDINGS: Lower chest: Perw clear lung bases.  Heart normal in size. Hepatobiliary: No focal liver abnormality is seen. No gallstones, gallbladder wall thickening, or biliary dilatation. Pancreas: Unremarkable. No pancreatic ductal dilatation or surrounding inflammatory changes. Spleen: Normal in size without focal abnormality. Adrenals/Urinary Tract: Adrenal glands are unremarkable. Kidneys are normal, without renal calculi, focal lesion, or hydronephrosis. Bladder is unremarkable. Stomach/Bowel: Normal stomach  and small bowel. Moderate increased stool mildly distends the right and transverse colon. No colonic wall thickening. Left perineal inflammation with a small abscess. No other evidence of inflammation. Normal appendix visualized. Vascular/Lymphatic: No significant vascular findings are present. No enlarged abdominal or pelvic lymph nodes. Reproductive: Prostate is unremarkable. Other: There are extraperitoneal abscesses in the pelvis and low abdomen. Extending from the left perianal/perirectal abscess, there is a larger left sided pelvic abscess measuring 12 cm from anterior to posterior, approximately 4.3 cm right to left. This abscess is contiguous with abscesses that extend along the deep margin of the anterior abdominal wall fascia, superficial to the peritoneum. On the left this extends superiorly to the level of the left kidney, at least 23 cm in length. This extends on the right from the right anterior inferior pelvis to the level of the inferior margin of the liver. The fluid collections connect across the anterior midline, anterior/superior to the bladder. Musculoskeletal: No fracture or acute finding.  No bone lesion. IMPRESSION: 1. Left perirectal/perianal abscess with associated inflammation connects to a larger extraperitoneal abscesses in the pelvis that extend along the deep margin of the anterior abdominal wall, superficial to the peritoneum. Largest portion of the contiguous collections is in the left pelvis adjacent to the bladder. 2. No other acute abnormality within the abdomen or pelvis. 3. Moderate increased colonic stool burden with mild distension of the right and transverse colon. Electronically Signed   By: Amie Portlandavid  Ormond M.D.   On: 02/24/2020 20:11   CT IMAGE GUIDED DRAINAGE BY PERCUTANEOUS CATHETER  Result Date: 02/25/2020 INDICATION: 49 year old male with progressive perirectal abscess. He presents for CT-guided drain placement. EXAM: CT-guided drain placement MEDICATIONS: The patient  is currently admitted to the hospital and receiving intravenous antibiotics. The antibiotics were administered within an appropriate time frame prior to the initiation of the procedure. ANESTHESIA/SEDATION: Fentanyl 100 mcg IV; Versed 2 mg IV Moderate Sedation Time:  17 minutes The patient was continuously monitored during the procedure by the interventional radiology nurse under my direct supervision. COMPLICATIONS: None immediate. PROCEDURE: Informed written consent was obtained from the patient after a thorough discussion of the procedural risks, benefits and alternatives. All questions were addressed. Maximal Sterile Barrier Technique was utilized including caps, mask, sterile gowns, sterile gloves, sterile drape, hand hygiene and skin antiseptic. A timeout was performed prior to the initiation of the procedure. A planning axial CT scan was performed. The fluid collection extending superiorly from the left perirectal space was successfully identified. An anterior approach in the left medial inguinal recess was selected. The skin was marked before being sterilely prepped and draped in the standard fashion using chlorhexidine skin prep. Local anesthesia was attained by infiltration with 1% lidocaine. A small dermatotomy was made. Under intermittent CT guidance, an 18 gauge trocar needle was advanced into the fluid collection. A 0.035 wire was then coiled in the fluid collection. The skin tract was dilated to 12 [place]. A Cook 12 [place] all-purpose  drainage catheter was then advanced over the wire and formed. Aspiration yields approximately 20 mL of thick purulent fluid. A sample was sent for Gram stain and culture. The drainage catheter was then flushed, connected to JP bulb suction and secured to the skin with 0 Prolene suture. CT imaging demonstrates a well-positioned drainage catheter without evidence of complication. The patient tolerated the procedure well. IMPRESSION: Successful placement of 12 French  percutaneous drainage catheter into the left perirectal abscess. Aspirated purulent fluid sent for Gram stain and culture. Electronically Signed   By: Malachy Moan M.D.   On: 02/25/2020 16:21   IR Radiologist Eval & Mgmt  Result Date: 03/16/2020 Please refer to notes tab for details about interventional procedure. (Op Note)   Labs:  CBC: Recent Labs    08/14/19 2021 02/24/20 1851 02/25/20 0338 03/01/20 0953  WBC 7.3 8.3 7.3 7.0  HGB 13.4 12.0* 9.7* 10.5*  HCT 38.6* 35.3* 29.5* 31.7*  PLT 238 674* 478* 303    COAGS: Recent Labs    02/25/20 0338  INR 1.1    BMP: Recent Labs    08/14/19 2021 02/24/20 1851 02/25/20 0338 03/01/20 0953  NA 137 137 137 136  K 4.1 5.7* 4.1 3.8  CL 101 100 102 101  CO2 28 25 28 26   GLUCOSE 101* 91 124* 107*  BUN 25* 22* 22* 27*  CALCIUM 9.2 9.0 8.5* 8.3*  CREATININE 0.90 0.97 0.95 0.72  GFRNONAA >60 >60 >60 >60  GFRAA >60  --   --   --     LIVER FUNCTION TESTS: Recent Labs    08/14/19 2021 02/24/20 1851  BILITOT 0.6 1.5*  AST 15 38  ALT 16 40  ALKPHOS 85 58  PROT 7.8 8.3*  ALBUMIN 4.8 3.4*    TUMOR MARKERS: No results for input(s): AFPTM, CEA, CA199, CHROMGRNA in the last 8760 hours.  Assessment and Plan:  49 year old with history of supralevator abscess and extension into the extraperitoneal pelvis.  Large pelvic abscess was drained with CT guidance on 02/25/2020.14/04/2019  Patient has no significant complaints at this time.  He reports minimal output from the drain.  CT abdomen and pelvis today demonstrates resolution of the large left pelvic abscess and the abscess collections along the lower anterior abdominal walls.  There is no fluid around the drain.  Small amount of residual low-density material in the left medial buttock which could represent small residual abscess or phlegmon collections.  Examination of this area did not demonstrate a sinus tract or wound.  Left anterior pelvic drain was removed due to the lack of  output and resolution based on CT.  Explained to the patient that there is a small amount of residual inflammatory changes in the left medial buttock but no draining wound at this time.  This area will need attention on follow-up and patient is scheduled to follow-up with general surgery in the near future.  Thank cheduled toyou for this interesting consult.  I greatly enjoyed meeting [name] and look forward to participating in their care.  A copy of this report was sent to the requesting provider on this date.  Electronically Signed: Vergie Living 03/16/2020, 12:48 PM   I spent a total of    15 Minutes in face to face in clinical consultation, greater than 50% of which was counseling/coordinating care for drain care.  Patient ID: [name], male   DOB: [birth year], 49 y.o.   MRN: [id number]

## 2020-08-24 ENCOUNTER — Other Ambulatory Visit: Payer: Self-pay | Admitting: Critical Care Medicine

## 2020-08-24 ENCOUNTER — Other Ambulatory Visit (HOSPITAL_COMMUNITY): Payer: Self-pay

## 2020-08-24 ENCOUNTER — Encounter: Payer: Self-pay | Admitting: Critical Care Medicine

## 2020-08-24 DIAGNOSIS — A5401 Gonococcal cystitis and urethritis, unspecified: Secondary | ICD-10-CM

## 2020-08-24 MED ORDER — ALBUTEROL SULFATE HFA 108 (90 BASE) MCG/ACT IN AERS
2.0000 | INHALATION_SPRAY | Freq: Four times a day (QID) | RESPIRATORY_TRACT | 0 refills | Status: DC | PRN
Start: 2020-08-24 — End: 2023-05-14
  Filled 2020-08-24: qty 8.5, 25d supply, fill #0

## 2020-08-24 MED ORDER — CLOBETASOL PROPIONATE 0.05 % EX CREA
1.0000 "application " | TOPICAL_CREAM | Freq: Two times a day (BID) | CUTANEOUS | 0 refills | Status: DC
  Filled 2020-08-24: qty 30, 30d supply, fill #0

## 2020-08-24 MED ORDER — DOXYCYCLINE HYCLATE 100 MG PO TABS
100.0000 mg | ORAL_TABLET | Freq: Two times a day (BID) | ORAL | 0 refills | Status: AC
  Filled 2020-08-24: qty 14, 7d supply, fill #0

## 2020-08-24 NOTE — Progress Notes (Signed)
Patient ID: [name], male   DOB: December 11, 1970, 50 y.o.   MRN: [id number] This is a 50 year old male seen in the [place] [facility].  [facility] and has been at the [facility] for 1 week.  Previously lived in [place].  Patient has a prior history of asthma was using albuterol as needed in the jail but does not have this medication he notes slight wheezing and chest tightness.  The patient has also some postnasal drainage but no yellow-green mucus.  He does also complain of eczema with itching in the upper arms and around the face.  The patient had COVID previously he did receive vaccination in February 2022 with the [facility].  Patient also has history of hypertension in the past he has been on low-dose hydrochlorothiazide.  On exam blood pressure 130/70 saturation 97% room air pulse 99 chest was clear cardiac exam unremarkable abdomen soft nontender skin shows eczema changes on the forearms and the face with mild purulence and  folliculitis around the nasal area  Impression is that of eczema with secondary infection on the face for this we will give doxycycline skin moisturization with sunblock screen and Temovate twice daily  There is no evidence of hypertension will not treat this for now Asthma with mild airway inflammation we will begin albuterol as needed  The patient will receive a referral to my clinic to establish for primary care for further health screenings

## 2020-08-25 ENCOUNTER — Other Ambulatory Visit (HOSPITAL_COMMUNITY): Payer: Self-pay

## 2020-10-12 ENCOUNTER — Ambulatory Visit: Admitting: Critical Care Medicine

## 2020-10-12 NOTE — Progress Notes (Deleted)
New Patient Office Visit  Subjective:  Patient ID: [name], male    DOB: 11/01/1970  Age: 50 y.o. MRN: [id number]  CC: No chief complaint on file.   HPI [name] presents for *** 08/24/20 [facility]: This is a 50 year old male seen in the [facility].  [facility] and has been at the [facility] for 1 week.  Previously lived in [place].  Patient has a prior history of asthma was using albuterol as needed in the jail but does not have this medication he notes slight wheezing and chest tightness.  The patient has also some postnasal drainage but no yellow-green mucus.  He does also complain of eczema with itching in the upper arms and around the face.  The patient had COVID previously he did receive vaccination in February 2022 with the [facility].  Patient also has history of hypertension in the past he has been on low-dose hydrochlorothiazide.   On exam blood pressure 130/70 saturation 97% room air pulse 99 chest was clear cardiac exam unremarkable abdomen soft nontender skin shows eczema changes on the forearms and the face with mild purulence and  folliculitis around the nasal area   Impression is that of eczema with secondary infection on the face for this we will give doxycycline skin moisturization with sunblock screen and Temovate twice daily   There is no evidence of hypertension will not treat this for now Asthma with mild airway inflammation we will begin albuterol as needed   The patient will receive a referral to my clinic to establish for primary care for further health screenings   Past Medical History:  Diagnosis Date   Anxiety    Asthma    Depression     Past Surgical History:  Procedure Laterality Date   IR RADIOLOGIST EVAL & MGMT  03/16/2020    Family History  Problem Relation Age of Onset   Prostate cancer Neg Hx    Bladder Cancer Neg Hx    Kidney cancer Neg Hx     Social  History   Socioeconomic History   Marital status: Divorced    Spouse name: Not on file   Number of children: Not on file   Years of education: Not on file   Highest education level: Not on file  Occupational History   Not on file  Tobacco Use   Smoking status: Every Day    Types: Cigarettes   Smokeless tobacco: Never  Substance and Sexual Activity   Alcohol use: Not Currently   Drug use: Yes    Types: Methamphetamines, "Crack" cocaine   Sexual activity: Yes    Partners: Female  Other Topics Concern   Not on file  Social History Narrative   Not on file   Social Determinants of Health   Financial Resource Strain: Not on file  Food Insecurity: Not on file  Transportation Needs: Not on file  Physical Activity: Not on file  Stress: Not on file  Social Connections: Not on file  Intimate Partner Violence: Not on file    ROS Review of Systems  Objective:   Today's Vitals: There were no vitals taken for this visit.  Physical Exam  Assessment & Plan:   Problem List Items Addressed This Visit   None   Outpatient Encounter Medications as of 10/12/2020  Medication Sig   albuterol (VENTOLIN HFA) 108 (90 Base) MCG/ACT inhaler Inhale 2 puffs into  the lungs every 6 (six) hours as needed for wheezing or shortness of breath.   clobetasol cream (TEMOVATE) 0.05 % Apply 1 application topically 2 (two) times daily.   No facility-administered encounter medications on file as of 10/12/2020.    Follow-up: No follow-ups on file.   Shan Levans, MD

## 2021-12-06 IMAGING — CT CT ABD-PELV W/ CM
2 of 5 series · 15 of 46 positions shown, 17 images · IV contrast (omnipaque)
Comparison: None.

CLINICAL DATA: Abdominal pain. Anal rectal abscess refractory to 20
days of antibiotics.

EXAM:
CT ABDOMEN AND PELVIS WITH CONTRAST
TECHNIQUE: Multidetector CT imaging of the abdomen and pelvis was performed
using the standard protocol following bolus administration of
intravenous contrast.
CONTRAST:  100mL OMNIPAQUE IOHEXOL 300 MG/ML  SOLN

[Series 2: axial st · axial · 0.72mm/px · z∈[+990,+1424]mm · 12 of 101 slices shown, 14 images]
[im 7/101  soft-tissue]
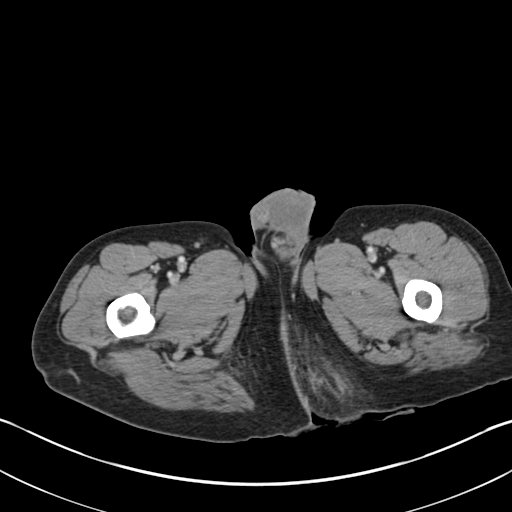
[im 7/101  bone]
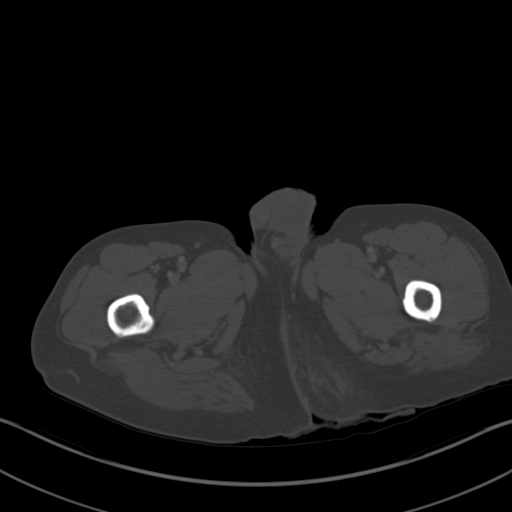
[im 14/101  soft-tissue]
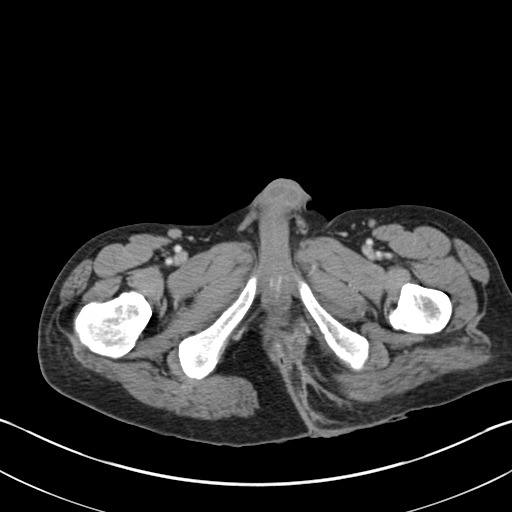
[im 21/101  soft-tissue]
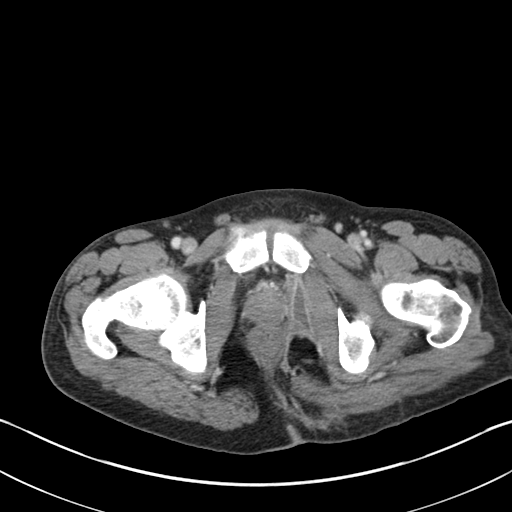
[im 34/101  soft-tissue]
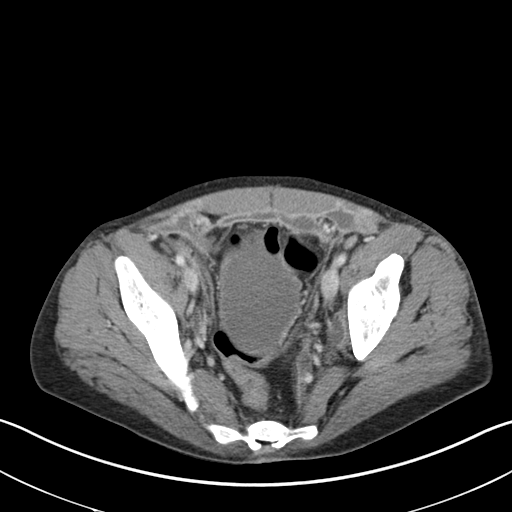
[im 41/101  soft-tissue]
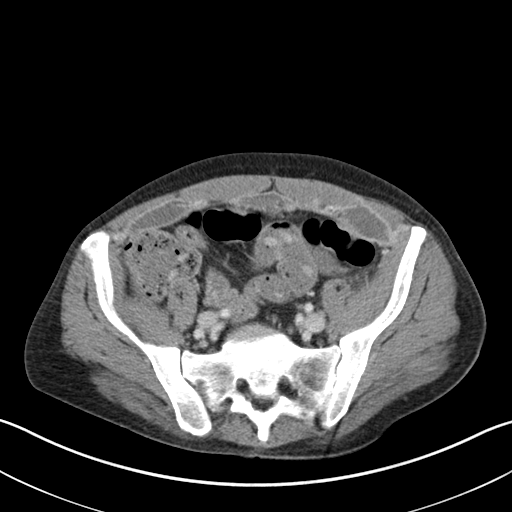
[im 47/101  soft-tissue]
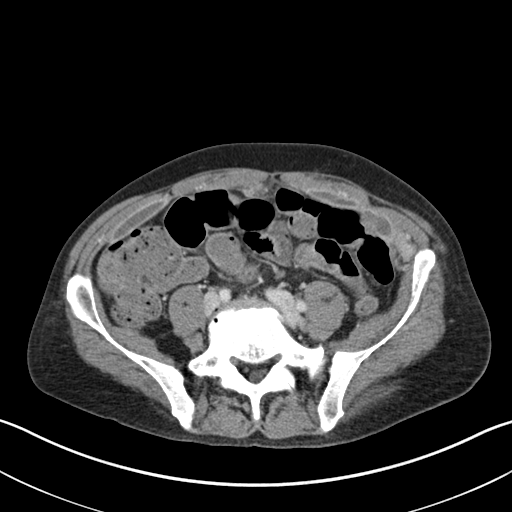
[im 54/101  soft-tissue]
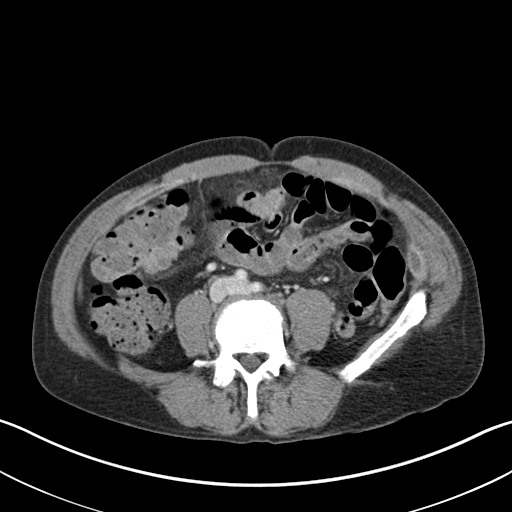
[im 61/101  soft-tissue]
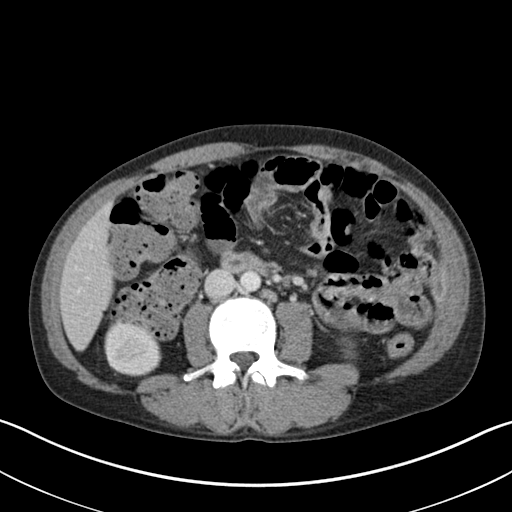
[im 67/101  soft-tissue]
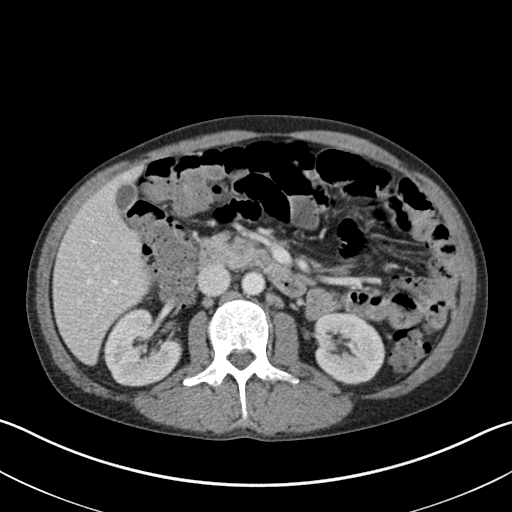
[im 67/101  bone]
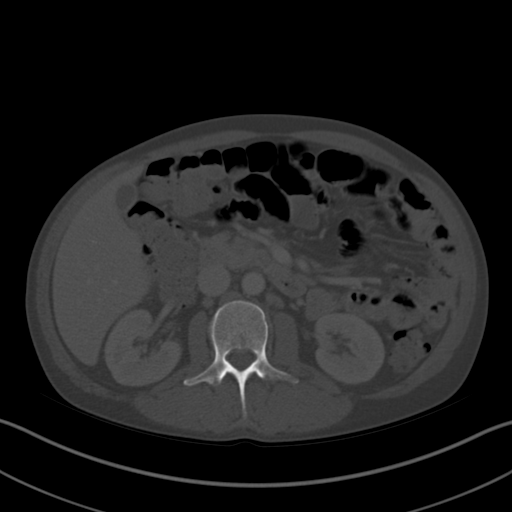
[im 81/101  soft-tissue]
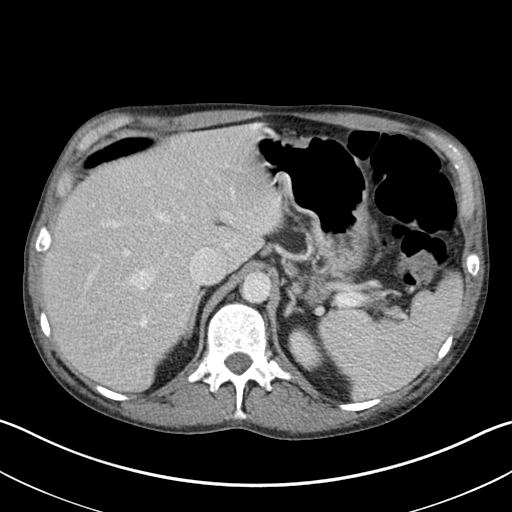
[im 87/101  soft-tissue]
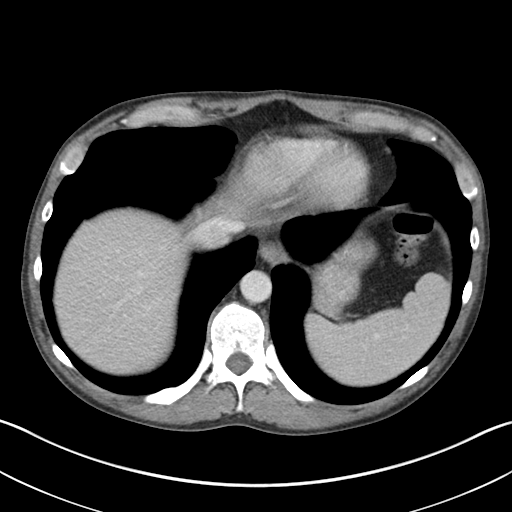
[im 94/101  soft-tissue]
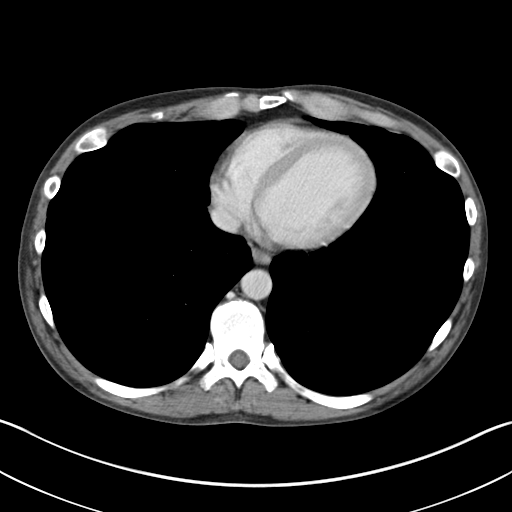

[Series 5: coronal st · coronal · 0.70mm/px · 3 of 134 slices shown]
[im 45/134  soft-tissue]
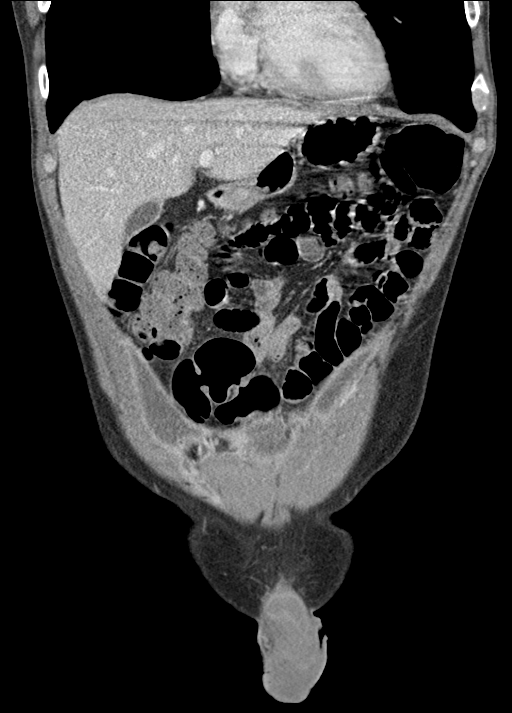
[im 60/134  soft-tissue]
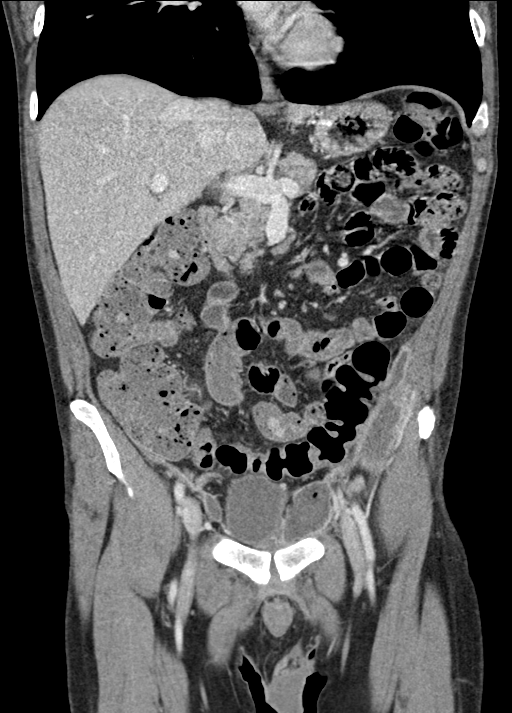
[im 74/134  soft-tissue]
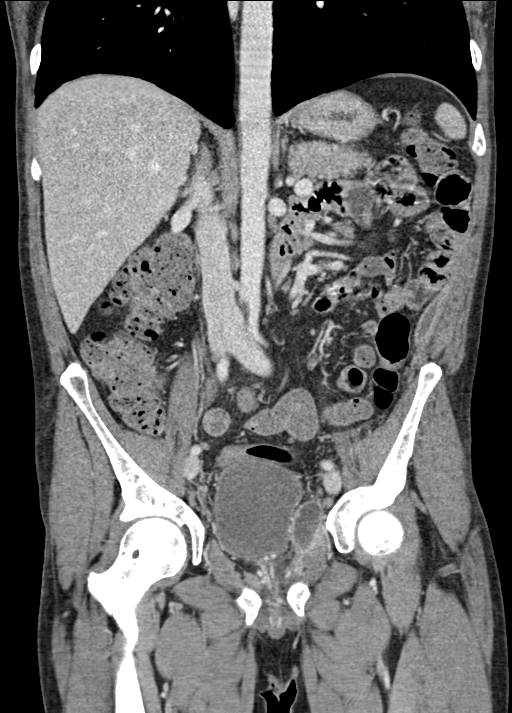

[15 of 46 positions shown; findings below may reference images not displayed]

FINDINGS: Lower chest: Perw clear lung bases.  Heart normal in size.

Hepatobiliary: No focal liver abnormality is seen. No gallstones,
gallbladder wall thickening, or biliary dilatation.

Pancreas: Unremarkable. No pancreatic ductal dilatation or
surrounding inflammatory changes.

Spleen: Normal in size without focal abnormality.

Adrenals/Urinary Tract: Adrenal glands are unremarkable. Kidneys are
normal, without renal calculi, focal lesion, or hydronephrosis.
Bladder is unremarkable.

Stomach/Bowel: Normal stomach and small bowel. Moderate increased
stool mildly distends the right and transverse colon. No colonic
wall thickening. Left perineal inflammation with a small abscess. No
other evidence of inflammation. Normal appendix visualized.

Vascular/Lymphatic: No significant vascular findings are present. No
enlarged abdominal or pelvic lymph nodes.

Reproductive: Prostate is unremarkable.

Other: There are extraperitoneal abscesses in the pelvis and low
abdomen. Extending from the left perianal/perirectal abscess, there
is a larger left sided pelvic abscess measuring 12 cm from anterior
to posterior, approximately 4.3 cm right to left. This abscess is
contiguous with abscesses that extend along the deep margin of the
anterior abdominal wall fascia, superficial to the peritoneum. On
the left this extends superiorly to the level of the left kidney, at
least 23 cm in length. This extends on the right from the right
anterior inferior pelvis to the level of the inferior margin of the
liver. The fluid collections connect across the anterior midline,
anterior/superior to the bladder.

Musculoskeletal: No fracture or acute finding.  No bone lesion.
IMPRESSION: 1. Left perirectal/perianal abscess with associated inflammation
connects to a larger extraperitoneal abscesses in the pelvis that
extend along the deep margin of the anterior abdominal wall,
superficial to the peritoneum. Largest portion of the contiguous
collections is in the left pelvis adjacent to the bladder.
2. No other acute abnormality within the abdomen or pelvis.
3. Moderate increased colonic stool burden with mild distension of
the right and transverse colon.

## 2021-12-13 DIAGNOSIS — S52125A Nondisplaced fracture of head of left radius, initial encounter for closed fracture: Secondary | ICD-10-CM

## 2021-12-13 HISTORY — DX: Nondisplaced fracture of head of left radius, initial encounter for closed fracture: S52.125A

## 2022-08-08 ENCOUNTER — Ambulatory Visit: Admitting: Family

## 2022-08-29 ENCOUNTER — Ambulatory Visit: Admitting: Family

## 2023-04-03 ENCOUNTER — Ambulatory Visit (INDEPENDENT_AMBULATORY_CARE_PROVIDER_SITE_OTHER): Payer: MEDICAID | Admitting: General Practice

## 2023-04-03 ENCOUNTER — Encounter: Payer: Self-pay | Admitting: General Practice

## 2023-04-03 ENCOUNTER — Ambulatory Visit (INDEPENDENT_AMBULATORY_CARE_PROVIDER_SITE_OTHER)
Admission: RE | Admit: 2023-04-03 | Discharge: 2023-04-03 | Disposition: A | Discharge status: Home / Self Care | Payer: MEDICAID | Source: Ambulatory Visit | Attending: General Practice | Admitting: General Practice

## 2023-04-03 VITALS — BP 136/66 | HR 84 | Temp 97.9°F | Resp 18 | Ht 69.5 in | Wt 203.2 lb

## 2023-04-03 DIAGNOSIS — Z0001 Encounter for general adult medical examination with abnormal findings: Secondary | ICD-10-CM

## 2023-04-03 DIAGNOSIS — G8929 Other chronic pain: Secondary | ICD-10-CM

## 2023-04-03 DIAGNOSIS — Z125 Encounter for screening for malignant neoplasm of prostate: Secondary | ICD-10-CM

## 2023-04-03 DIAGNOSIS — Z7689 Persons encountering health services in other specified circumstances: Secondary | ICD-10-CM | POA: Insufficient documentation

## 2023-04-03 DIAGNOSIS — F17200 Nicotine dependence, unspecified, uncomplicated: Secondary | ICD-10-CM | POA: Diagnosis not present

## 2023-04-03 DIAGNOSIS — R5383 Other fatigue: Secondary | ICD-10-CM | POA: Insufficient documentation

## 2023-04-03 DIAGNOSIS — M25511 Pain in right shoulder: Secondary | ICD-10-CM

## 2023-04-03 DIAGNOSIS — F1111 Opioid abuse, in remission: Secondary | ICD-10-CM | POA: Insufficient documentation

## 2023-04-03 DIAGNOSIS — Z1211 Encounter for screening for malignant neoplasm of colon: Secondary | ICD-10-CM

## 2023-04-03 DIAGNOSIS — Z1159 Encounter for screening for other viral diseases: Secondary | ICD-10-CM

## 2023-04-03 DIAGNOSIS — Z0000 Encounter for general adult medical examination without abnormal findings: Secondary | ICD-10-CM | POA: Insufficient documentation

## 2023-04-03 DIAGNOSIS — J302 Other seasonal allergic rhinitis: Secondary | ICD-10-CM | POA: Insufficient documentation

## 2023-04-03 HISTORY — DX: Opioid abuse, in remission: F11.11

## 2023-04-03 LAB — COMPREHENSIVE METABOLIC PANEL
ALT: 22 U/L (ref 0–53)
AST: 17 U/L (ref 0–37)
Albumin: 4.7 g/dL (ref 3.5–5.2)
Alkaline Phosphatase: 79 U/L (ref 39–117)
BUN: 41 mg/dL — ABNORMAL HIGH (ref 6–23)
CO2: 29 meq/L (ref 19–32)
Calcium: 9.2 mg/dL (ref 8.4–10.5)
Chloride: 105 meq/L (ref 96–112)
Creatinine, Ser: 1.06 mg/dL (ref 0.40–1.50)
GFR: 80.77 mL/min (ref >60.00)
Glucose, Bld: 96 mg/dL (ref 70–99)
Potassium: 4.3 meq/L (ref 3.5–5.1)
Sodium: 139 meq/L (ref 135–145)
Total Bilirubin: 0.4 mg/dL (ref 0.2–1.2)
Total Protein: 6.7 g/dL (ref 6.0–8.3)

## 2023-04-03 LAB — LIPID PANEL
Cholesterol: 150 mg/dL (ref 0–200)
HDL: 39.5 mg/dL (ref >39.00)
LDL Cholesterol: 93 mg/dL (ref 0–99)
NonHDL: 110.88
Total CHOL/HDL Ratio: 4
Triglycerides: 90 mg/dL (ref 0.0–149.0)
VLDL: 18 mg/dL (ref 0.0–40.0)

## 2023-04-03 LAB — CBC
HCT: 43.8 % (ref 39.0–52.0)
Hemoglobin: 14.9 g/dL (ref 13.0–17.0)
MCHC: 34.1 g/dL (ref 30.0–36.0)
MCV: 99.8 fL (ref 78.0–100.0)
Platelets: 206 10*3/uL (ref 150.0–400.0)
RBC: 4.39 Mil/uL (ref 4.22–5.81)
RDW: 13.1 % (ref 11.5–15.5)
WBC: 7.1 10*3/uL (ref 4.0–10.5)

## 2023-04-03 LAB — TSH: TSH: 0.88 u[IU]/mL (ref 0.35–5.50)

## 2023-04-03 LAB — HEMOGLOBIN A1C: Hgb A1c MFr Bld: 6.1 % (ref 4.6–6.5)

## 2023-04-03 LAB — TESTOSTERONE: Testosterone: 136.16 ng/dL — ABNORMAL LOW (ref 300.00–890.00)

## 2023-04-03 MED ORDER — FLUTICASONE PROPIONATE 50 MCG/ACT NA SUSP: 2.0000 | Freq: Every day | NASAL | 0 refills | Status: DC

## 2023-04-03 NOTE — Assessment & Plan Note (Signed)
 Chronic.   Exam stable.   Start Flonase (fluticasone) nasal spray. Instill 1 spray in each nostril twice daily.

## 2023-04-03 NOTE — Assessment & Plan Note (Signed)
 Chronic.   Differentials include arthritis, underlying injury  X-ray ordered STAT.   Hx of opoid abuse. Continue ibuprofen and ice as needed.  Consider ortho referral if needed.

## 2023-04-03 NOTE — Assessment & Plan Note (Signed)
 Immunizations tetanus UTD, declines flu and shingles vaccine.  Colonoscopy  due  PSA due and pending.  Discussed the importance of a healthy diet and regular exercise in order for weight loss, and to reduce the risk of further co-morbidity.  Exam stable. Labs pending.  Follow up in 1 year for repeat physical.

## 2023-04-03 NOTE — Assessment & Plan Note (Signed)
 Controlled for last three years.   No concerns today.

## 2023-04-03 NOTE — Assessment & Plan Note (Signed)
 Smoking cessation instruction/counseling given:  counseled patient on the dangers of tobacco use, advised patient to stop smoking, and reviewed strategies to maximize success .  Lung cancer screening pending.

## 2023-04-03 NOTE — Addendum Note (Signed)
 Addended by: Lovena Neighbours on: 04/03/2023 10:39 AM   Modules accepted: Orders

## 2023-04-03 NOTE — Progress Notes (Addendum)
 New Patient Office Visit  Subjective    Patient ID: [name], male    DOB: February 26, 1971  Age: 53 y.o. MRN: [id number]  CC:  Chief Complaint  Patient presents with   Establish Care    HPI [name] is a 53 y.o. male presents to establish care, for complete physical and follow up of chronic conditions.  Last pcp/physical/labs: several years ago. Does not know who the last PCP was.    Immunizations: -Tetanus: Completed in 2023 -Influenza: declines -Shingles: declines  Diet: usually eats healthy but has been off due to holidays. Exercise: regular exercise.  Eye exam: Completes annually  Dental exam: wears partial dentures.   Colonoscopy: due Lung cancer screening: due  PSA: Due  Hx of substance abuse- clean for almost three years. Did methadone clinic. Has stopped that.   Asthma: no concerns today. Uses the albuterol  sparingly.   Anxiety/depression: no concerns.  Right shoulder pain: started several years ago and it is worsening as he has started lifting weights. No recent falls or trauma. He is concerned about his rotator cuff. He usually takes ibuprofen .   Fatigue: started noticing that he is more tired lately. Unsure what the cause is. Would like to have labs done including testosterone  levels check. No contributing factors. Denies blood in urine or stool.    Outpatient Encounter Medications as of 04/03/2023  Medication Sig   albuterol  (VENTOLIN  HFA) 108 (90 Base) MCG/ACT inhaler Inhale 2 puffs into the lungs every 6 (six) hours as needed for wheezing or shortness of breath.   Amino Acids (AMINO ACID PO) Take by mouth.   Collagen-Vitamin C-Biotin (COLLAGEN PO) Take by mouth.   CREATINE PO Take by mouth.   fluticasone  (FLONASE ) 50 MCG/ACT nasal spray Place 2 sprays into both nostrils daily.   Magnesium 80 MG TABS Take by mouth.   Melatonin-Theanine (MELATONIN CALM SLEEP PO) Take by mouth.   PROTEIN PO Take by mouth.   Vit D-Ca Beta Hydrox Beta Meth (HMB  PRO PO) Take by mouth.   VITAMIN D , CHOLECALCIFEROL, PO Take by mouth.   [DISCONTINUED] clobetasol  cream (TEMOVATE ) 0.05 % Apply 1 application topically 2 (two) times daily.   No facility-administered encounter medications on file as of 04/03/2023.    Past Medical History:  Diagnosis Date   Abuse, drug or alcohol (HCC)    Anxiety    Asthma    Closed nondisplaced fracture of head of left radius 12/13/2021   Depression     Past Surgical History:  Procedure Laterality Date   IR RADIOLOGIST EVAL & MGMT  03/16/2020    Family History  Problem Relation Age of Onset   Cancer Father        Lung   Prostate cancer Neg Hx    Bladder Cancer Neg Hx    Kidney cancer Neg Hx     Social History   Socioeconomic History   Marital status: Divorced    Spouse name: Not on file   Number of children: Not on file   Years of education: Not on file   Highest education level: Not on file  Occupational History   Not on file  Tobacco Use   Smoking status: Every Day    Current packs/day: 2.00    Average packs/day: 2.0 packs/day for 32.8 years (65.5 ttl pk-yrs)    Types: Cigarettes    Start date: 07/25/1990   Smokeless tobacco: Never  Vaping Use   Vaping status: Never Used  Substance and Sexual  Activity   Alcohol use: Not Currently   Drug use: Not Currently    Types: Methamphetamines, Cocaine, Other-see comments    Comment: opiods.   Sexual activity: Yes    Partners: Female  Other Topics Concern   Not on file  Social History Narrative   Not on file   Social Drivers of Health   Financial Resource Strain: Not on file  Food Insecurity: Not on file  Transportation Needs: Not on file  Physical Activity: Not on file  Stress: Not on file  Social Connections: Not on file  Intimate Partner Violence: Not on file    Review of Systems  Constitutional:  Positive for malaise/fatigue. Negative for chills, fever and weight loss.  HENT:  Negative for congestion, ear discharge, ear pain, hearing  loss, nosebleeds, sinus pain, sore throat and tinnitus.        Runny nose  Eyes:  Negative for blurred vision, double vision, pain, discharge and redness.  Respiratory:  Negative for cough, shortness of breath, wheezing and stridor.   Cardiovascular:  Negative for chest pain, palpitations and leg swelling.  Gastrointestinal:  Negative for abdominal pain, constipation, diarrhea, heartburn, nausea and vomiting.  Genitourinary:  Negative for dysuria, frequency and urgency.  Musculoskeletal:  Negative for myalgias.  Skin:  Negative for rash.  Neurological:  Negative for dizziness, tingling, seizures, weakness and headaches.  Psychiatric/Behavioral:  Negative for depression, substance abuse and suicidal ideas. The patient is not nervous/anxious.         Objective    BP 136/66   Pulse 84   Temp 97.9 F (36.6 C)   Resp 18   Ht 5' 9.5 (1.765 m)   Wt 203 lb 4 oz (92.2 kg)   SpO2 96%   BMI 29.58 kg/m   Physical Exam Vitals and nursing note reviewed.  Constitutional:      Appearance: Normal appearance.  HENT:     Head: Normocephalic and atraumatic.     Right Ear: Tympanic membrane, ear canal and external ear normal.     Left Ear: Tympanic membrane, ear canal and external ear normal.     Nose: Nose normal.     Mouth/Throat:     Mouth: Mucous membranes are moist.     Pharynx: Oropharynx is clear.  Eyes:     Conjunctiva/sclera: Conjunctivae normal.     Pupils: Pupils are equal, round, and reactive to light.  Cardiovascular:     Rate and Rhythm: Normal rate and regular rhythm.     Pulses: Normal pulses.     Heart sounds: Normal heart sounds.  Pulmonary:     Effort: Pulmonary effort is normal.     Breath sounds: Normal breath sounds.  Abdominal:     General: Abdomen is flat. Bowel sounds are normal.     Palpations: Abdomen is soft.  Musculoskeletal:        General: Normal range of motion.     Right shoulder: No tenderness. Normal range of motion.     Left shoulder: No  tenderness. Normal range of motion.     Cervical back: Normal range of motion.     Comments: Empty can test negative bilaterally.  Skin:    General: Skin is warm and dry.     Capillary Refill: Capillary refill takes less than 2 seconds.  Neurological:     General: No focal deficit present.     Mental Status: He is alert and oriented to person, place, and time. Mental status is at baseline.  Psychiatric:        Mood and Affect: Mood normal.        Behavior: Behavior normal.        Thought Content: Thought content normal.        Judgment: Judgment normal.         Assessment & Plan:  Colon cancer screening -     Ambulatory referral to Gastroenterology  Smoker Assessment & Plan: Smoking cessation instruction/counseling given:  counseled patient on the dangers of tobacco use, advised patient to stop smoking, and reviewed strategies to maximize success .  Lung cancer screening pending.  Orders: -     Ambulatory Referral for Lung Cancer Scre -     CBC -     Comprehensive metabolic panel  Chronic right shoulder pain Assessment & Plan: Chronic.   Differentials include arthritis, underlying injury  X-ray ordered STAT.   Hx of opoid abuse. Continue ibuprofen  and ice as needed.  Consider ortho referral if needed.  Orders: -     DG Shoulder Right  Seasonal allergies Assessment & Plan: Chronic.   Exam stable.   Start Flonase  (fluticasone ) nasal spray. Instill 1 spray in each nostril twice daily.    Orders: -     Fluticasone  Propionate; Place 2 sprays into both nostrils daily.  Dispense: 16 g; Refill: 0  Fatigue, unspecified type Assessment & Plan: Unclear etiology. No red flags today. Exam stable.  Will check labs to rule out metabolic cause.   Orders: -     CBC -     Comprehensive metabolic panel -     Testosterone  -     TSH -     Lipid panel -     Hemoglobin A1c  Need for hepatitis C screening test -     Hepatitis C antibody  Encounter for screening  and preventative care Assessment & Plan: Immunizations tetanus UTD, declines flu and shingles vaccine.  Colonoscopy  due  PSA due and pending.  Discussed the importance of a healthy diet and regular exercise in order for weight loss, and to reduce the risk of further co-morbidity.  Exam stable. Labs pending.  Follow up in 1 year for repeat physical.    Encounter to establish care with new doctor Assessment & Plan: EMR reviewed briefly.   Screening for prostate cancer -     PSA  Hx of opioid abuse [facility]) Assessment & Plan: Controlled for last three years.   No concerns today.      Return in about 4 weeks (around 05/01/2023) for fatigue and shoulder pain.[name] [name], NP

## 2023-04-03 NOTE — Assessment & Plan Note (Signed)
 Unclear etiology. No red flags today. Exam stable.  Will check labs to rule out metabolic cause.

## 2023-04-03 NOTE — Assessment & Plan Note (Signed)
 EMR reviewed briefly.

## 2023-04-03 NOTE — Patient Instructions (Signed)
 Stop by the lab prior to leaving today. I will notify you of your results once received.   Complete xray(s) prior to leaving today. I will notify you of your results once received.  Nasal Congestion/Ear Pressure/Sinus Pressure: Try using Flonase  (fluticasone ) nasal spray. Instill 1 spray in each nostril twice daily.    You will either be contacted via phone regarding your referral for lung cancer screening and colonoscopy , or you may receive a letter on your MyChart portal from our referral team with instructions for scheduling an appointment. Please let us  know if you have not been contacted by anyone within two weeks.  It was a pleasure meeting you!

## 2023-04-04 ENCOUNTER — Telehealth: Payer: Self-pay | Admitting: General Practice

## 2023-04-04 ENCOUNTER — Other Ambulatory Visit: Payer: Self-pay | Admitting: General Practice

## 2023-04-04 DIAGNOSIS — R799 Abnormal finding of blood chemistry, unspecified: Secondary | ICD-10-CM

## 2023-04-04 LAB — PSA: PSA: 0.81 ng/mL (ref <=4.00)

## 2023-04-04 LAB — HEPATITIS C ANTIBODY: Hepatitis C Ab: NONREACTIVE

## 2023-04-04 NOTE — Telephone Encounter (Signed)
 Copied from CRM [id number]. Topic: General - Call Back - No Documentation >> Apr 04, 2023  1:16 PM [name] wrote: Reason for CRM: Patient states he missed a call from [place] and would like her to call him back after a few hours because he's at the gym.SABRAand would like only to discuss lab results with her.  Patient requests a call back to discuss lab results

## 2023-04-05 ENCOUNTER — Telehealth: Payer: Self-pay

## 2023-04-05 NOTE — Telephone Encounter (Signed)
 Copied from CRM [id number]. Topic: Clinical - Lab/Test Results >> Apr 05, 2023 10:50 AM Corin V wrote: Reason for CRM: Patient calling back regarding lab results. Please return patient call when available.

## 2023-04-05 NOTE — Telephone Encounter (Signed)
 Patient was advised during his colonoscopy triage that he would need to stop taking all vitamins and supplements 5 days prior to his colonoscopy.    He stated, that ain't going to happen.  Informed patient that he can call back to schedule when he is willing to stop vitamins and supplements.  Referral closed due to refusal to comply with prep instructions.  Thanks, [place], CMA

## 2023-04-05 NOTE — Telephone Encounter (Signed)
Spoke with patient about results. See results note for further documentation.

## 2023-04-17 ENCOUNTER — Other Ambulatory Visit: Payer: Self-pay

## 2023-05-01 ENCOUNTER — Ambulatory Visit (INDEPENDENT_AMBULATORY_CARE_PROVIDER_SITE_OTHER)
Admission: RE | Admit: 2023-05-01 | Discharge: 2023-05-01 | Disposition: A | Discharge status: Home / Self Care | Payer: MEDICAID | Source: Ambulatory Visit | Attending: General Practice | Admitting: General Practice

## 2023-05-01 ENCOUNTER — Encounter: Payer: Self-pay | Admitting: General Practice

## 2023-05-01 ENCOUNTER — Ambulatory Visit: Payer: MEDICAID | Admitting: General Practice

## 2023-05-01 ENCOUNTER — Ambulatory Visit (HOSPITAL_COMMUNITY): Admission: RE | Admit: 2023-05-01 | Source: Ambulatory Visit

## 2023-05-01 VITALS — BP 122/82 | HR 66 | Temp 98.3°F | Ht 69.5 in | Wt 202.0 lb

## 2023-05-01 DIAGNOSIS — M25511 Pain in right shoulder: Secondary | ICD-10-CM | POA: Diagnosis not present

## 2023-05-01 DIAGNOSIS — R7989 Other specified abnormal findings of blood chemistry: Secondary | ICD-10-CM

## 2023-05-01 DIAGNOSIS — F17200 Nicotine dependence, unspecified, uncomplicated: Secondary | ICD-10-CM

## 2023-05-01 DIAGNOSIS — Z1211 Encounter for screening for malignant neoplasm of colon: Secondary | ICD-10-CM

## 2023-05-01 DIAGNOSIS — M25561 Pain in right knee: Secondary | ICD-10-CM

## 2023-05-01 DIAGNOSIS — M25512 Pain in left shoulder: Secondary | ICD-10-CM

## 2023-05-01 DIAGNOSIS — Z23 Encounter for immunization: Secondary | ICD-10-CM | POA: Diagnosis not present

## 2023-05-01 DIAGNOSIS — G8929 Other chronic pain: Secondary | ICD-10-CM | POA: Diagnosis not present

## 2023-05-01 DIAGNOSIS — R5383 Other fatigue: Secondary | ICD-10-CM

## 2023-05-01 MED ORDER — METHYLPREDNISOLONE 4 MG PO TBPK: ORAL_TABLET | ORAL | 0 refills | Status: DC

## 2023-05-01 NOTE — Assessment & Plan Note (Signed)
 Labs reviewed with patient.   Low testosterone  level could be contributing to the fatigue.  Referral placed for urology for evaluation.

## 2023-05-01 NOTE — Assessment & Plan Note (Addendum)
 Chronic. X-ray shows join degeneration.  Agreeable to start physical therapy.  Discussed using tylenol  arthritis, aleve, ibuprofen  as needed.   Consider ortho referral with Dr. Geralyn Knee.

## 2023-05-01 NOTE — Progress Notes (Signed)
 Established Patient Office Visit  Subjective   Patient ID: [name], male    DOB: Mar 18, 1971  Age: 53 y.o. MRN: [id number]  Chief Complaint  Patient presents with   Shoulder Pain    Right shoulder pain x couple years. Takes advil  for the pain as needed; helps some.    Fatigue   Knee Pain    Right knee since last week. Patient was at gym doing leg press and felt a pop.    Shoulder Pain  Pertinent negatives include no fever.  Knee Pain   Seng Larch is a 53 year old male with past medical history of seasonal allergies chronic right shoulder pain, hx of opioid abuse, fatigue presents today to discuss shoulder pain and fatigue.   Right shoulder pain: chronic. Activity makes it works. Pain is constant and dull. Its always there. He has tried ibuprofen . He had an x-ray on 04/03/23 which showed right glenohumeral joint degeneration. He would like to try physical therapy.   Fatigue: chronic. He  testosterone  level checked on 04/03/23 which was low.   Right knee pain: started last Wednesday. Patient was at gym and doing leg press and felt a pop. Pain does not radiate. Pain is constant and dull. He can put weight but does cause discomfort.     Patient Active Problem List   Diagnosis Date Noted   Acute pain of right knee 05/01/2023   Low testosterone  in male 05/01/2023   Colon cancer screening 05/01/2023   Smoker 04/03/2023   Chronic right shoulder pain 04/03/2023   Seasonal allergies 04/03/2023   Fatigue 04/03/2023   Encounter for screening and preventative care 04/03/2023   Encounter to establish care with new doctor 04/03/2023   Hx of opioid abuse (HCC) 04/03/2023   Past Medical History:  Diagnosis Date   Abuse, drug or alcohol (HCC)    Anxiety    Asthma    Closed nondisplaced fracture of head of left radius 12/13/2021   Depression    No Known Allergies       05/01/2023   11:32 AM 04/03/2023   10:47 AM  Depression screen PHQ 2/9  Decreased Interest 0 0  Down,  Depressed, Hopeless 0 0  PHQ - 2 Score 0 0  Altered sleeping 0 1  Tired, decreased energy 0 1  Change in appetite 0 0  Feeling bad or failure about yourself  0 0  Trouble concentrating 0 2  Moving slowly or fidgety/restless 0 0  Suicidal thoughts 0 0  PHQ-9 Score 0 4  Difficult doing work/chores Not difficult at all Somewhat difficult       05/01/2023   11:32 AM 04/03/2023   10:48 AM  GAD 7 : Generalized Anxiety Score  Nervous, Anxious, on Edge 0 0  Control/stop worrying 0 0  Worry too much - different things 0 0  Trouble relaxing 0 0  Restless 0 2  Easily annoyed or irritable 0 2  Afraid - awful might happen 0 0  Total GAD 7 Score 0 4  Anxiety Difficulty Not difficult at all Somewhat difficult      Review of Systems  Constitutional:  Positive for malaise/fatigue. Negative for chills and fever.  Respiratory:  Negative for shortness of breath.   Cardiovascular:  Negative for chest pain.  Gastrointestinal:  Negative for abdominal pain, constipation, diarrhea, heartburn, nausea and vomiting.  Genitourinary:  Negative for dysuria, frequency and urgency.  Musculoskeletal:  Positive for joint pain.  Right shoulder pain, right knee  Neurological:  Negative for dizziness and headaches.  Endo/Heme/Allergies:  Negative for polydipsia.  Psychiatric/Behavioral:  Negative for depression and suicidal ideas. The patient is not nervous/anxious.       Objective:     BP 122/82 (BP Location: Left Arm, Patient Position: Sitting, Cuff Size: Normal)   Pulse 66   Temp 98.3 F (36.8 C) (Oral)   Ht 5' 9.5 (1.765 m)   Wt 202 lb (91.6 kg)   SpO2 95%   BMI 29.40 kg/m  BP Readings from Last 3 Encounters:  05/01/23 122/82  04/03/23 136/66  08/24/20 130/70   Wt Readings from Last 3 Encounters:  05/01/23 202 lb (91.6 kg)  04/03/23 203 lb 4 oz (92.2 kg)  02/25/20 145 lb 11.2 oz (66.1 kg)      Physical Exam Vitals and nursing note reviewed.  Constitutional:      Appearance:  Normal appearance.  Cardiovascular:     Rate and Rhythm: Normal rate and regular rhythm.     Pulses: Normal pulses.     Heart sounds: Normal heart sounds.  Pulmonary:     Effort: Pulmonary effort is normal.     Breath sounds: Normal breath sounds.  Musculoskeletal:        General: No swelling or deformity.     Right shoulder: Normal. No swelling. Normal range of motion.     Left shoulder: Normal. Normal range of motion.     Right knee: Bony tenderness present. No swelling. Normal range of motion.     Left knee: No swelling. Normal range of motion. No tenderness.     Right lower leg: No edema.     Left lower leg: No edema.  Neurological:     Mental Status: He is alert and oriented to person, place, and time.  Psychiatric:        Mood and Affect: Mood normal.        Behavior: Behavior normal.        Thought Content: Thought content normal.        Judgment: Judgment normal.      No results found for any visits on 05/01/23.     The 10-year ASCVD risk score (Arnett DK, et al., 2019) is: 7%    Assessment & Plan:  Chronic right shoulder pain Assessment & Plan: Chronic. X-ray shows join degeneration.  Agreeable to start physical therapy.  Discussed using tylenol  arthritis, aleve, ibuprofen  as needed.   Consider ortho referral with Dr. Watt.   Orders: -     Ambulatory referral to Physical Therapy -     methylPREDNISolone ; Take per package instructions  Dispense: 21 tablet; Refill: 0  Colon cancer screening Assessment & Plan: Discussed at length with patient. Agreeable and verbalizes understanding about the need to stop vitamins and protein prior to procedure.  Agreeable to complete colonoscopy in the spring.  2nd referral placed.  Orders: -     Ambulatory referral to Gastroenterology  Acute pain of right knee Assessment & Plan: Acute.  X-ray ordered to rule out fracture or dislocation.   Rx sent for methylprednisolone  pack.  Discuss rest, ice, heat.    Consider ortho referral if not improving.  Orders: -     methylPREDNISolone ; Take per package instructions  Dispense: 21 tablet; Refill: 0 -     DG Knee 1-2 Views Right; Future  Low testosterone  in male Assessment & Plan: Labs reviewed with patient.   Low testosterone  level could be contributing to the  fatigue.  Referral placed for urology for evaluation.  Orders: -     Ambulatory referral to Urology  Encounter for immunization -     Pneumococcal conjugate vaccine 20-valent  Chronic left shoulder pain Assessment & Plan: Chronic. X-ray shows join degeneration.  Agreeable to start physical therapy.  Discussed using tylenol  arthritis, aleve, ibuprofen  as needed.   Consider ortho referral with Dr. Watt.    Fatigue, unspecified type Assessment & Plan: Suspect this is due to low testosterone . Discussed at length that it could be a possibly.   Labs did show low testosterone  level.   Referral placed for urology.     Return in about 2 weeks (around 05/15/2023) for shoulder, knee.    [name], NP

## 2023-05-01 NOTE — Assessment & Plan Note (Signed)
 Discussed at length with patient. Agreeable and verbalizes understanding about the need to stop vitamins and protein prior to procedure.  Agreeable to complete colonoscopy in the spring.  2nd referral placed.

## 2023-05-01 NOTE — Assessment & Plan Note (Signed)
 Discussed cessation.  declined at this time.

## 2023-05-01 NOTE — Assessment & Plan Note (Signed)
 Suspect this is due to low testosterone . Discussed at length that it could be a possibly.   Labs did show low testosterone  level.   Referral placed for urology.

## 2023-05-01 NOTE — Assessment & Plan Note (Signed)
 Acute.  X-ray ordered to rule out fracture or dislocation.   Rx sent for methylprednisolone  pack.  Discuss rest, ice, heat.   Consider ortho referral if not improving.

## 2023-05-01 NOTE — Patient Instructions (Addendum)
 Lung cancer screening- Dear Mr. Javid,   You have been referred to the Lung Cancer Screening program.  Our program will assist you with completing an annual Low Dose CT of the chest.  This is a recognized screening tool for early detection of lung cancer and is offered to eligible participants who have a cigarette smoking history.     If you are interested in scheduling this CT scan or would like further discussion on our program, please contact us  at [phone].  Available screening locations are in [place], [place], [place] and surrounding areas for your convenience.   I placed another referral for colonoscopy. Please get this scheduled sooner than later. As discussed, you will need to stop your vitamins and protein 5-10 days prior to your procedure. Please ensure this to avoid any complications with anesthesia and your prep prior to procedure.   You will either be contacted via phone regarding your referral to urology for testosterone , or you may receive a letter on your MyChart portal from our referral team with instructions for scheduling an appointment. Please let us  know if you have not been contacted by anyone within two weeks.  You will either be contacted via phone regarding your referral to physical therapy , or you may receive a letter on your MyChart portal from our referral team with instructions for scheduling an appointment. Please let us  know if you have not been contacted by anyone within two weeks.  Follow up with me on two weeks for shoulder pain and knee pain.  It was a pleasure to see you today!

## 2023-05-14 ENCOUNTER — Ambulatory Visit (INDEPENDENT_AMBULATORY_CARE_PROVIDER_SITE_OTHER): Payer: MEDICAID | Admitting: General Practice

## 2023-05-14 ENCOUNTER — Encounter: Payer: Self-pay | Admitting: General Practice

## 2023-05-14 VITALS — BP 132/70 | HR 93 | Ht 69.5 in | Wt 204.0 lb

## 2023-05-14 DIAGNOSIS — F17200 Nicotine dependence, unspecified, uncomplicated: Secondary | ICD-10-CM | POA: Diagnosis not present

## 2023-05-14 DIAGNOSIS — M25561 Pain in right knee: Secondary | ICD-10-CM | POA: Diagnosis not present

## 2023-05-14 DIAGNOSIS — M25511 Pain in right shoulder: Secondary | ICD-10-CM

## 2023-05-14 DIAGNOSIS — J4520 Mild intermittent asthma, uncomplicated: Secondary | ICD-10-CM | POA: Diagnosis not present

## 2023-05-14 DIAGNOSIS — R7989 Other specified abnormal findings of blood chemistry: Secondary | ICD-10-CM

## 2023-05-14 DIAGNOSIS — Z23 Encounter for immunization: Secondary | ICD-10-CM

## 2023-05-14 DIAGNOSIS — G8929 Other chronic pain: Secondary | ICD-10-CM

## 2023-05-14 MED ORDER — ALBUTEROL SULFATE HFA 108 (90 BASE) MCG/ACT IN AERS: 2.0000 | INHALATION_SPRAY | Freq: Four times a day (QID) | RESPIRATORY_TRACT | 0 refills | Status: DC | PRN

## 2023-05-14 NOTE — Progress Notes (Signed)
Established Patient Office Visit  Subjective   Patient ID: [name], male    DOB: [birth year]  Age: 53 y.o. MRN: [id number]  Chief Complaint  Patient presents with   Shoulder Pain    Patient here today to follow up on right shoulder pain    [name] is a 53 year old male with past medical history of seasonal allergies, chronic right shoulder pain, hx of opioid abuse, fatigue, presents today for a follow up.   Shoulder Pain  Pertinent negatives include no fever.   Right shoulder pain/knee pain: He was evaluated on 05/01/23 and was given prednisone. He has completed the medication and reports that his symptoms are overall improving. He is starting PT tomorrow.   Low testosterone: He was found to have low testosterone levels, which he believe is contributing to his fatigue. Scheduled to see Dr. Pete Glatter at [facility].   Asthma- diagnosed several year ago. He has not had any recent flare ups/exacerbations. He is currently managed on Albuterol inhaler and he uses it once a week.   Shingles vaccine- he would like to get the first dose of the shingles vaccine today.    Patient Active Problem List   Diagnosis Date Noted   Mild intermittent asthma without complication 05/14/2023   Need for shingles vaccine 05/14/2023   Acute pain of right knee 05/01/2023   Low testosterone in male 05/01/2023   Colon cancer screening 05/01/2023   Smoker 04/03/2023   Chronic right shoulder pain 04/03/2023   Encounter for screening and preventative care 04/03/2023   Past Medical History:  Diagnosis Date   Abuse, drug or alcohol (HCC)    Anxiety    Asthma    Closed nondisplaced fracture of head of left radius 12/13/2021   Depression    Hx of opioid abuse (HCC) 04/03/2023   No Known Allergies       05/14/2023   10:59 AM 05/01/2023   11:32 AM 04/03/2023   10:47 AM  Depression screen PHQ 2/9  Decreased Interest 0 0 0  Down, Depressed, Hopeless 0 0 0  PHQ - 2 Score 0 0 0  Altered  sleeping 0 0 1  Tired, decreased energy 0 0 1  Change in appetite 0 0 0  Feeling bad or failure about yourself  0 0 0  Trouble concentrating 0 0 2  Moving slowly or fidgety/restless 0 0 0  Suicidal thoughts 0 0 0  PHQ-9 Score 0 0 4  Difficult doing work/chores Not difficult at all Not difficult at all Somewhat difficult       05/14/2023   10:59 AM 05/01/2023   11:32 AM 04/03/2023   10:48 AM  GAD 7 : Generalized Anxiety Score  Nervous, Anxious, on Edge 0 0 0  Control/stop worrying 0 0 0  Worry too much - different things 0 0 0  Trouble relaxing 0 0 0  Restless 0 0 2  Easily annoyed or irritable 0 0 2  Afraid - awful might happen 0 0 0  Total GAD 7 Score 0 0 4  Anxiety Difficulty Not difficult at all Not difficult at all Somewhat difficult      Review of Systems  Constitutional:  Negative for chills and fever.  Respiratory:  Negative for shortness of breath.   Cardiovascular:  Negative for chest pain.  Gastrointestinal:  Negative for abdominal pain, constipation, diarrhea, heartburn, nausea and vomiting.  Genitourinary:  Negative for dysuria, frequency and urgency.  Neurological:  Negative for dizziness  and headaches.  Endo/Heme/Allergies:  Negative for polydipsia.  Psychiatric/Behavioral:  Negative for depression and suicidal ideas. The patient is not nervous/anxious.       Objective:     BP 132/70 (BP Location: Left Arm, Patient Position: Sitting, Cuff Size: Normal)   Pulse 93   Ht 5' 9.5" (1.765 m)   Wt 204 lb (92.5 kg)   SpO2 98%   BMI 29.69 kg/m  BP Readings from Last 3 Encounters:  05/14/23 132/70  05/01/23 122/82  04/03/23 136/66   Wt Readings from Last 3 Encounters:  05/14/23 204 lb (92.5 kg)  05/01/23 202 lb (91.6 kg)  04/03/23 203 lb 4 oz (92.2 kg)      Physical Exam Vitals and nursing note reviewed.  Constitutional:      Appearance: Normal appearance.  Cardiovascular:     Rate and Rhythm: Normal rate and regular rhythm.     Pulses: Normal  pulses.     Heart sounds: Normal heart sounds.  Pulmonary:     Effort: Pulmonary effort is normal.     Breath sounds: Normal breath sounds.  Neurological:     Mental Status: He is alert and oriented to person, place, and time.  Psychiatric:        Mood and Affect: Mood normal.        Behavior: Behavior normal.        Thought Content: Thought content normal.        Judgment: Judgment normal.      No results found for any visits on 05/14/23.     The 10-year ASCVD risk score (Arnett DK, et al., 2019) is: 8%    Assessment & Plan:  Mild intermittent asthma without complication Assessment & Plan: No recent flare ups.  Controlled.   Continue Albuterol as needed. Rx given.  Orders: -     Albuterol Sulfate HFA; Inhale 2 puffs into the lungs every 6 (six) hours as needed for wheezing or shortness of breath.  Dispense: 8.5 g; Refill: 0  Need for shingles vaccine Assessment & Plan: Given first dose today.   Orders: -     Varicella-zoster vaccine IM  Acute pain of right knee Assessment & Plan: Resolved.   Discussed PT in future if needed or appt with Dr. Patsy Lager if needed.   Chronic right shoulder pain Assessment & Plan: Improving.  Starts PT tomorrow.   Discussed if symptoms do not improve, schedule appt with Dr. Patsy Lager if needed.   Smoker Assessment & Plan: Discussed cessation.   Smoking cessation instruction/counseling given:  counseled patient on the dangers of tobacco use, advised patient to stop smoking, and reviewed strategies to maximize success   Low testosterone in male Assessment & Plan: He has an appt with Urology at the end of the month.    Return in about 6 months (around 11/11/2023) for chronic care management.Gregory Charon, NP

## 2023-05-14 NOTE — Assessment & Plan Note (Addendum)
Discussed cessation.   Smoking cessation instruction/counseling given:  counseled patient on the dangers of tobacco use, advised patient to stop smoking, and reviewed strategies to maximize success

## 2023-05-14 NOTE — Assessment & Plan Note (Signed)
Resolved.   Discussed PT in future if needed or appt with Dr. Patsy Lager if needed.

## 2023-05-14 NOTE — Assessment & Plan Note (Signed)
Given first dose today.

## 2023-05-14 NOTE — Patient Instructions (Addendum)
Schedule appt with Dr. Patsy Lager if shoulder or knees bothers you again.   Call and schedule lung cancer screening.   Schedule 6 month follow up- smoking, testosterone.

## 2023-05-14 NOTE — Assessment & Plan Note (Signed)
No recent flare ups.  Controlled.   Continue Albuterol as needed. Rx given.

## 2023-05-14 NOTE — Assessment & Plan Note (Addendum)
Improving.  Starts PT tomorrow.   Discussed if symptoms do not improve, schedule appt with Dr. Patsy Lager if needed.

## 2023-05-14 NOTE — Assessment & Plan Note (Signed)
He has an appt with Urology at the end of the month.

## 2023-05-15 ENCOUNTER — Ambulatory Visit: Payer: MEDICAID | Admitting: Physical Therapy

## 2023-05-15 ENCOUNTER — Encounter: Payer: Self-pay | Admitting: General Practice

## 2023-05-16 ENCOUNTER — Encounter: Payer: Self-pay | Admitting: *Deleted

## 2023-05-20 ENCOUNTER — Other Ambulatory Visit: Payer: Self-pay | Admitting: General Practice

## 2023-05-20 DIAGNOSIS — J302 Other seasonal allergic rhinitis: Secondary | ICD-10-CM

## 2023-05-22 ENCOUNTER — Ambulatory Visit: Payer: MEDICAID | Attending: General Practice

## 2023-05-22 ENCOUNTER — Other Ambulatory Visit: Payer: Self-pay

## 2023-05-22 DIAGNOSIS — M25511 Pain in right shoulder: Secondary | ICD-10-CM | POA: Insufficient documentation

## 2023-05-22 DIAGNOSIS — G8929 Other chronic pain: Secondary | ICD-10-CM | POA: Diagnosis present

## 2023-05-22 DIAGNOSIS — M6281 Muscle weakness (generalized): Secondary | ICD-10-CM | POA: Insufficient documentation

## 2023-05-22 NOTE — Patient Instructions (Signed)
 Access Code: [email or web address] URL: [email or web address] Date: 05/22/2023 Prepared by: Edwinna Areola  Exercises - Standing Radial Nerve Glide  - 1 x daily - 7 x weekly - 2-3 sets - 10 reps - Shoulder External Rotation and Scapular Retraction with Resistance  - 1 x daily - 7 x weekly - 3 sets - 10 reps - red band hold - Standing Shoulder Posterior Capsule Stretch  - 1 x daily - 7 x weekly - 2 reps - 30 sec hold - Doorway Pec Stretch at 90 Degrees Abduction  - 1 x daily - 7 x weekly - 2-3 reps - 30 sec hold - Putty Squeezes  - 1 x daily - 7 x weekly - 2-3 min hold

## 2023-05-22 NOTE — Therapy (Addendum)
 OUTPATIENT PHYSICAL THERAPY SHOULDER EVALUATION   Patient Name: [name] MRN: [id number] DOB:1971/02/22, 53 y.o., male Today's Date: 05/22/2023  END OF SESSION:  PT End of Session - 05/22/23 1139     Visit Number 1    Number of Visits 17    Date for PT Re-Evaluation 07/17/23    Authorization Type [employer]    PT Start Time 1024    PT Stop Time 1104    PT Time Calculation (min) 40 min    Activity Tolerance Patient tolerated treatment well    Behavior During Therapy [facility] for tasks assessed/performed            Past Medical History:  Diagnosis Date   Abuse, drug or alcohol (HCC)    Anxiety    Asthma    Closed nondisplaced fracture of head of left radius 12/13/2021   Depression    Hx of opioid abuse (HCC) 04/03/2023   Past Surgical History:  Procedure Laterality Date   IR RADIOLOGIST EVAL & MGMT  03/16/2020   Patient Active Problem List   Diagnosis Date Noted   Mild intermittent asthma without complication 05/14/2023   Need for shingles vaccine 05/14/2023   Acute pain of right knee 05/01/2023   Low testosterone in male 05/01/2023   Colon cancer screening 05/01/2023   Smoker 04/03/2023   Chronic right shoulder pain 04/03/2023   Encounter for screening and preventative care 04/03/2023   PCP: Modesto Charon, NP  REFERRING PROVIDER: Modesto Charon, NP  REFERRING DIAG: [phone] (ICD-10-CM) - Chronic right shoulder pain   THERAPY DIAG:  Chronic right shoulder pain  Muscle weakness (generalized)  Rationale for Evaluation and Treatment: Rehabilitation  ONSET DATE: Chronic  SUBJECTIVE:                                                                                                                                                                                      SUBJECTIVE STATEMENT: Patient comes in with c/c of right shoulder pain. Has been having pain for a few years but does not have a specific accident or instance he can remember that hurt the  shoulder. The pain moves from the shoulder to the bicep. He notices it gets worst when he is sleeping. He frequently goes to the gym x6 a week and pushes through his UE pain if he has it. N/T in right elbow. He has noticed a loss of ROM over time.   Hand dominance: Ambidextrous  PAIN:  Are you having pain? Yes: NPRS scale: 5/10 worst, 2/10 on average Pain location: side of shoulder and back of shoulder Pain description: naggy, constant  Aggravating factors: sleeping Relieving factors: medication  PRECAUTIONS: None  RED FLAGS: None   WEIGHT BEARING RESTRICTIONS: No  FALLS:  Has patient fallen in last 6 months? No  LIVING ENVIRONMENT: Lives with: lives with their family Lives in: House/apartment Stairs: No Has following equipment at home: None  OCCUPATION: N/A  PLOF: Independent  PATIENT GOALS: Not having nagging pain, more education on what is going on, improve strength.   OBJECTIVE:  Note: Objective measures were completed at Evaluation unless otherwise noted.  DIAGNOSTIC FINDINGS:  Severe right glenohumeral joint space loss with bulky osteophytosis and subchondral sclerosis. Proximal right humerus intact. Right scapula and visible right clavicle appear intact. Comparatively mild degeneration at the right [facility] joint. Negative visible right ribs and chest.   PATIENT SURVEYS:  Quick Dash 20/55  COGNITION: Overall cognitive status: Within functional limits for tasks assessed     SENSATION: WFL  POSTURE: Rounded shoulders, forward head, increased thoracic kyphosis   UPPER EXTREMITY ROM:   Active ROM Right eval Left eval  Shoulder flexion 182 185  Shoulder extension    Shoulder abduction 175 182  Shoulder adduction    Shoulder internal rotation T7 T5  Shoulder external rotation T2 T2  Elbow flexion    Elbow extension    Wrist flexion    Wrist extension    Wrist ulnar deviation    Wrist radial deviation    Wrist pronation    Wrist supination     (Blank rows = not tested)  UPPER EXTREMITY MMT:  MMT Right eval Left eval  Shoulder flexion 4 4  Shoulder extension    Shoulder abduction 4- 4  Shoulder adduction    Shoulder internal rotation 4 4  Shoulder external rotation 4 4  Middle trapezius    Lower trapezius    Elbow flexion    Elbow extension    Wrist flexion    Wrist extension    Wrist ulnar deviation    Wrist radial deviation    Wrist pronation    Wrist supination    Grip strength (lbs) 75 90  (Blank rows = not tested) SH SPECIAL TESTS:   ULTT: Positive  FUNCTIONAL TEST:   Grip Strength   JOINT MOBILITY TESTING:  Inferior mobs: no change Posterior mobs: painful   Anterior mobs: fine   PALPATION:  Tenderness to biceps tendon and anterior shoulder, no tenderness to posterior capsule                                                                                                   TREATMENT DATE:  05/22/2023:  Posterior capsule cross body stretch  Doorway pec stretch  Standing radial nerve glide  Shoulder ER RTB  Putty squeezes  PATIENT EDUCATION: Education details: Exam findings, POC, HEP.  Person educated: Patient Education method: Explanation, Demonstration, Tactile cues, Verbal cues, and Handouts Education comprehension: verbalized understanding, returned demonstration, verbal cues required, tactile cues required, and needs further education  HOME EXERCISE PROGRAM: Access Code: [email or web address]  ASSESSMENT:  CLINICAL IMPRESSION: Patient is a 53 y.o. male who was seen today for physical therapy evaluation and treatment for right shoulder pain and ROM  limitations. Patient appeared anxious upon arrival with heightened awareness of surroundings. Expressed he has never been to PT before and was unsure of what to expect. Patient reports he has been dealing with a constant nagging in his right shoulder for years that he can not find the root of. Imaging shows severe right glenohumeral joint space loss with  bulky osteophytosis and subchondral sclerosis. ROM and strength almost equal on right and left arm with no pain. Some difficulty in R IR with tightness in the posterior capsule.Tenderness to bicep and anterior lateral shoulder and muscular tightness notable. No concerns of pain during session and no increase of symptoms. Patient does note neural referral into elbow, ULTT indicates potential radial nerve involvement. HEP created today including a radial nerve glide. POC discussed and patient recommended to come in 2xweek if possible for 4 weeks. Patient would benefit from skilled PT to work on shoulder specific strengthening and to decrease muscular tightness in order to improve functional ability and decrease shoulder pain.  OBJECTIVE IMPAIRMENTS: decreased ROM, decreased strength, and pain.   ACTIVITY LIMITATIONS: carrying, lifting, sleeping, and dressing  PARTICIPATION LIMITATIONS: community activity  PERSONAL FACTORS: Behavior pattern, Past/current experiences, and Time since onset of injury/illness/exacerbation are also affecting patient's functional outcome.   REHAB POTENTIAL: [facility]  CLINICAL DECISION MAKING: Evolving/moderate complexity  EVALUATION COMPLEXITY: Moderate  GOALS: Goals reviewed with patient? Yes  SHORT TERM GOALS: Target date: 06/19/2023  Patient will be I with initial HEP in order to progress with therapy. Baseline: Goal status: INITIAL  2.  Patient will score </= 4/10 at worse on the NPS by 3rd visit in order to show an improvement in pain allowing for an increase in functional ability.  Baseline: 5/10 Goal status: INITIAL   LONG TERM GOALS: Target date: 07/17/2023  Patient will be independent with final HEP in order to continue treatment at home for pain tolerance.  Baseline:  Goal status: INITIAL  2.  Patient will score </= 2/10 at worse on the NPS to show an improvement in pain allowing for an increase in functional ability.  Baseline: 5/10 Goal  status: INITIAL  3.  Patient will score a 10/55 on the QuickDASH in order to show an improvement in functional activity according to MCID guidelines. Baseline: 20/55 Goal status: INITIAL  4. Patient will increase right grip strength by 10 lbs in order to increase functional activity of the right arm.   Baseline: 75 lbs  Goal status: INITIAL  PLAN:  PT FREQUENCY: 1-2x/week  PT DURATION: 8 weeks  PLANNED INTERVENTIONS: 97164- PT Re-evaluation, 97110-Therapeutic exercises, 97530- Therapeutic activity, 97112- Neuromuscular re-education, 97535- Self Care, 16109- Manual therapy, Patient/Family education, Dry Needling, Joint mobilization, Joint manipulation, Moist heat, and Biofeedback  PLAN FOR NEXT SESSION: review/revise HEP. Work on shoulder isometric strengthening. IR. Loosening shoulder and posterior capsule.    Erin Hearing, Student-PT 05/22/2023, 3:47 PM

## 2023-05-23 ENCOUNTER — Ambulatory Visit (INDEPENDENT_AMBULATORY_CARE_PROVIDER_SITE_OTHER): Payer: MEDICAID | Admitting: Urology

## 2023-05-23 ENCOUNTER — Encounter: Payer: Self-pay | Admitting: Urology

## 2023-05-23 VITALS — BP 129/80 | HR 83 | Ht 70.0 in | Wt 205.0 lb

## 2023-05-23 DIAGNOSIS — R7989 Other specified abnormal findings of blood chemistry: Secondary | ICD-10-CM

## 2023-05-23 NOTE — Progress Notes (Signed)
 Assessment: 1. 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 Diagnosis and management of low testosterone/hypogonadism discussed with the patient today.  Options for management reviewed with the patient including topical therapy, oral therapy, short acting injections, long-acting injections, and subcutaneous pellets. Recommend further evaluation with additional laboratory testing given findings of low testosterone. AM labs: Testosterone, LH, prolactin, estradiol Will contact him with results and recommendations for treatment.  Chief Complaint:  Chief Complaint  Patient presents with   Hypogonadism    History of Present Illness:  [name] is a 53 y.o. male who is seen in consultation from Modesto Charon, NP for evaluation of low testosterone. He has had symptoms of fatigue, decreased energy, decreased libido x 18 month.  He has not experienced problems with ED. ADAM score = 9/10  (no loss of height)  Labs from 1/25: Testosterone 136.16 PSA  0.81  He is not interested in maintaining his fertility.  No LUTS.  No dysuria or gross hematuria.  Past Medical History:  Past Medical History:  Diagnosis Date   Abuse, drug or alcohol (HCC)    Anxiety    Asthma    Closed nondisplaced fracture of head of left radius 12/13/2021   Depression    Hx of opioid abuse (HCC) 04/03/2023    Past Surgical History:  Past Surgical History:  Procedure Laterality Date   IR RADIOLOGIST EVAL & MGMT  03/16/2020    Allergies:  No Known Allergies  Family History:  Family History  Problem Relation Age of Onset   Cancer Father        Lung   Prostate cancer Neg Hx    Bladder Cancer Neg Hx    Kidney cancer Neg Hx     Social History:  Social History   Tobacco Use   Smoking status: Every Day    Current packs/day: 2.00    Average packs/day: 2.0 packs/day for 32.8 years (65.7 ttl pk-yrs)    Types: Cigarettes     Start date: 07/25/1990   Smokeless tobacco: Never  Vaping Use   Vaping status: Never Used  Substance Use Topics   Alcohol use: Not Currently   Drug use: Not Currently    Types: Methamphetamines, Cocaine, Other-see comments    Comment: opiods.    Review of symptoms:  Constitutional:  Negative for unexplained weight loss, night sweats, fever, chills ENT:  Negative for nose bleeds, sinus pain, painful swallowing CV:  Negative for chest pain, shortness of breath, exercise intolerance, palpitations, loss of consciousness Resp:  Negative for cough, wheezing, shortness of breath GI:  Negative for nausea, vomiting, diarrhea, bloody stools GU:  Positives noted in HPI; otherwise negative for gross hematuria, dysuria, urinary incontinence Neuro:  Negative for seizures, poor balance, limb weakness, slurred speech Psych:  Negative for lack of energy, depression, anxiety Endocrine:  Negative for polydipsia, polyuria, symptoms of hypoglycemia (dizziness, hunger, sweating) Hematologic:  Negative for anemia, purpura, petechia, prolonged or excessive bleeding, use of anticoagulants  Allergic:  Negative for difficulty breathing or choking as a result of exposure to anything; no shellfish allergy; no allergic response (rash/itch) to materials, foods  Physical exam: BP 129/80   Pulse 83   Ht 5\' 10"  (1.778 m)   Wt 205 lb (93 kg)   BMI 29.41 kg/m  GENERAL APPEARANCE:  Well appearing, well developed, well nourished, NAD HEENT: Atraumatic, Normocephalic, oropharynx clear. NECK: Supple without lymphadenopathy or thyromegaly. LUNGS: Clear  to auscultation bilaterally. HEART: Regular Rate and Rhythm without murmurs, gallops, or rubs. ABDOMEN: Soft, non-tender, No Masses. EXTREMITIES: Moves all extremities well.  Without clubbing, cyanosis, or edema. NEUROLOGIC:  Alert and oriented x 3, normal gait, CN II-XII grossly intact.  MENTAL STATUS:  Appropriate. BACK:  Non-tender to palpation.  No CVAT SKIN:   Warm, dry and intact.   GU: Penis:  uncircumcised Meatus: Normal Scrotum: normal, no masses Testis: normal without masses bilateral   Results: None

## 2023-05-24 ENCOUNTER — Ambulatory Visit: Payer: MEDICAID | Admitting: General Practice

## 2023-05-24 ENCOUNTER — Encounter: Payer: Self-pay | Admitting: General Practice

## 2023-05-24 VITALS — BP 120/80 | HR 84 | Temp 98.0°F | Ht 70.0 in | Wt 206.0 lb

## 2023-05-24 DIAGNOSIS — R7989 Other specified abnormal findings of blood chemistry: Secondary | ICD-10-CM

## 2023-05-24 NOTE — Patient Instructions (Signed)
 You will either be contacted via phone regarding your referral to b, or you may receive a letter on your MyChart portal from our referral team with instructions for scheduling an appointment. Please let us know if you have not been contacted by anyone within two weeks.

## 2023-05-24 NOTE — Progress Notes (Signed)
 Established Patient Office Visit  Subjective   Patient ID: [name], male    DOB: 09-06-1970  Age: 53 y.o. MRN: [id number]  Chief Complaint  Patient presents with   Referral    Would like to see a different urologist    HPI  [name] is 53 year old male with past medical history chronic right shoulder pain, smoker, asthma, low testosterone levels presents today for an acute visit   He was referred to the urology and had an appointment yesterday. He feels upset and does not understand why the labs need to wait until next week to be drawn and why he can't have testosterone shots to give himself at home.   Overall, he is happy with the care he received but just felt confused about the plan that was laid out for him. He is requesting a referral to [facility] in [place] to see if things can be moved along a little quicker for him.   He denies any urinary symptoms today, chest pain, shortness of breath or difficulty breathing.   Patient Active Problem List   Diagnosis Date Noted   Mild intermittent asthma without complication 05/14/2023   Need for shingles vaccine 05/14/2023   Acute pain of right knee 05/01/2023   Low testosterone in male 05/01/2023   Colon cancer screening 05/01/2023   Smoker 04/03/2023   Chronic right shoulder pain 04/03/2023   Encounter for screening and preventative care 04/03/2023   Past Medical History:  Diagnosis Date   Abuse, drug or alcohol (HCC)    Anxiety    Asthma    Closed nondisplaced fracture of head of left radius 12/13/2021   Depression    Hx of opioid abuse (HCC) 04/03/2023   Past Surgical History:  Procedure Laterality Date   IR RADIOLOGIST EVAL & MGMT  03/16/2020   No Known Allergies       05/24/2023    2:24 PM 05/14/2023   10:59 AM 05/01/2023   11:32 AM  Depression screen PHQ 2/9  Decreased Interest 0 0 0  Down, Depressed, Hopeless 0 0 0  PHQ - 2 Score 0 0 0  Altered sleeping 0 0 0  Tired, decreased energy  0 0 0  Change in appetite 0 0 0  Feeling bad or failure about yourself  0 0 0  Trouble concentrating 0 0 0  Moving slowly or fidgety/restless 0 0 0  Suicidal thoughts 0 0 0  PHQ-9 Score 0 0 0  Difficult doing work/chores Not difficult at all Not difficult at all Not difficult at all       05/14/2023   10:59 AM 05/01/2023   11:32 AM 04/03/2023   10:48 AM  GAD 7 : Generalized Anxiety Score  Nervous, Anxious, on Edge 0 0 0  Control/stop worrying 0 0 0  Worry too much - different things 0 0 0  Trouble relaxing 0 0 0  Restless 0 0 2  Easily annoyed or irritable 0 0 2  Afraid - awful might happen 0 0 0  Total GAD 7 Score 0 0 4  Anxiety Difficulty Not difficult at all Not difficult at all Somewhat difficult      Review of Systems  Constitutional:  Negative for chills and fever.  Respiratory:  Negative for shortness of breath.   Cardiovascular:  Negative for chest pain.  Gastrointestinal:  Negative for abdominal pain, constipation, diarrhea, heartburn, nausea and vomiting.  Genitourinary:  Negative for dysuria, frequency and urgency.  Neurological:  Negative for dizziness and headaches.  Endo/Heme/Allergies:  Negative for polydipsia.  Psychiatric/Behavioral:  Negative for depression and suicidal ideas. The patient is not nervous/anxious.       Objective:     BP 120/80 (BP Location: Left Arm, Patient Position: Sitting, Cuff Size: Normal)   Pulse 84   Temp 98 F (36.7 C) (Oral)   Ht 5\' 10"  (1.778 m)   Wt 206 lb (93.4 kg)   SpO2 98%   BMI 29.56 kg/m  BP Readings from Last 3 Encounters:  05/24/23 120/80  05/23/23 129/80  05/14/23 132/70   Wt Readings from Last 3 Encounters:  05/24/23 206 lb (93.4 kg)  05/23/23 205 lb (93 kg)  05/14/23 204 lb (92.5 kg)      Physical Exam Vitals and nursing note reviewed.  Constitutional:      Appearance: Normal appearance.  Cardiovascular:     Rate and Rhythm: Normal rate and regular rhythm.     Pulses: Normal pulses.     Heart  sounds: Normal heart sounds.  Pulmonary:     Effort: Pulmonary effort is normal.     Breath sounds: Normal breath sounds.  Neurological:     Mental Status: He is alert and oriented to person, place, and time.  Psychiatric:        Mood and Affect: Mood normal.        Behavior: Behavior normal.        Thought Content: Thought content normal.        Judgment: Judgment normal.      No results found for any visits on 05/24/23.     The 10-year ASCVD risk score (Arnett DK, et al., 2019) is: 6.8%    Assessment & Plan:  Low testosterone in male Assessment & Plan: Uncontrolled.   Discussed at length with patient and reviewed notes from urology with the patient.   He understands why the labs have to be drawn next week.   Referral placed for [facility] urology per patient request.  Orders: -     Ambulatory referral to Urology    Return if symptoms worsen or fail to improve.    Modesto Charon, NP

## 2023-05-24 NOTE — Assessment & Plan Note (Signed)
 Uncontrolled.   Discussed at length with patient and reviewed notes from urology with the patient.   He understands why the labs have to be drawn next week.   Referral placed for [facility] urology per patient request.

## 2023-05-27 ENCOUNTER — Other Ambulatory Visit: Payer: Self-pay | Admitting: Emergency Medicine

## 2023-05-27 ENCOUNTER — Telehealth: Payer: Self-pay | Admitting: Acute Care

## 2023-05-27 DIAGNOSIS — Z87891 Personal history of nicotine dependence: Secondary | ICD-10-CM

## 2023-05-27 DIAGNOSIS — Z122 Encounter for screening for malignant neoplasm of respiratory organs: Secondary | ICD-10-CM

## 2023-05-27 NOTE — Telephone Encounter (Signed)
 Lung Cancer Screening Narrative/Criteria Questionnaire (Cigarette Smokers Only- No Cigars/Pipes/vapes)   [name]   SDMV:06/06/2023 at 10:00 with [place]        [birth year]   LDCT: 06/12/2023 at 11:40 at [facility]     53 y.o.   Phone: [phone]  Lung Screening Narrative (confirm age 53-77 yrs Medicare / 50-80 yrs Private pay insurance)   Insurance information:[employer]   Referring Provider:Bableen Evelene Croon NP   This screening involves an initial phone call with a team member from our program. It is called a shared decision making visit. The initial meeting is required by  insurance and Medicare to make sure you understand the program. This appointment takes about 15-20 minutes to complete. You will complete the screening scan at your scheduled date/time.  This scan takes about 5-10 minutes to complete. You can eat and drink normally before and after the scan.  Criteria questions for Lung Cancer Screening:   Are you a current or former smoker? Current Age began smoking: 53yo   If you are a former smoker, what year did you quit smoking? Quit for 1 year but started back(within 15 yrs)   To calculate your smoking history, I need an accurate estimate of how many packs of cigarettes you smoked per day and for how many years. (Not just the number of PPD you are now smoking)   Years smoking 30 x Packs per day 1 = Pack years 30   (at least 20 pack yrs)   (Make sure they understand that we need to know how much they have smoked in the past, not just the number of PPD they are smoking now)  Do you have a personal history of cancer?  No    Do you have a family history of cancer? Yes  (cancer type and and relative) Father - lung  Are you coughing up blood?  No  Have you had unexplained weight loss of 15 lbs or more in the last 6 months? No  It looks like you meet all criteria.  When would be a good time for [place] to schedule you for this screening?   Additional information: N/A

## 2023-05-30 ENCOUNTER — Other Ambulatory Visit: Payer: MEDICAID

## 2023-05-30 ENCOUNTER — Encounter: Payer: Self-pay | Admitting: General Practice

## 2023-05-30 DIAGNOSIS — R7989 Other specified abnormal findings of blood chemistry: Secondary | ICD-10-CM

## 2023-05-31 LAB — TESTOSTERONE: Testosterone: 133 ng/dL — ABNORMAL LOW (ref 264–916)

## 2023-05-31 LAB — ESTRADIOL: Estradiol: 15.8 pg/mL (ref 7.6–42.6)

## 2023-05-31 LAB — PROLACTIN: Prolactin: 10.1 ng/mL (ref 3.6–25.2)

## 2023-05-31 LAB — LUTEINIZING HORMONE: LH: 26.7 m[IU]/mL — ABNORMAL HIGH (ref 1.7–8.6)

## 2023-05-31 NOTE — Addendum Note (Signed)
 Addended by: Hilbert Bible on: 05/31/2023 07:59 AM   Modules accepted: Orders

## 2023-06-03 ENCOUNTER — Other Ambulatory Visit: Payer: Self-pay | Admitting: Urology

## 2023-06-03 ENCOUNTER — Encounter: Payer: Self-pay | Admitting: Urology

## 2023-06-04 ENCOUNTER — Telehealth: Payer: Self-pay | Admitting: Physical Therapy

## 2023-06-04 ENCOUNTER — Other Ambulatory Visit: Payer: Self-pay | Admitting: Urology

## 2023-06-04 ENCOUNTER — Ambulatory Visit: Payer: MEDICAID | Attending: General Practice | Admitting: Physical Therapy

## 2023-06-04 ENCOUNTER — Telehealth: Payer: Self-pay | Admitting: Urology

## 2023-06-04 DIAGNOSIS — G8929 Other chronic pain: Secondary | ICD-10-CM | POA: Insufficient documentation

## 2023-06-04 DIAGNOSIS — M6281 Muscle weakness (generalized): Secondary | ICD-10-CM | POA: Insufficient documentation

## 2023-06-04 DIAGNOSIS — E291 Testicular hypofunction: Secondary | ICD-10-CM

## 2023-06-04 DIAGNOSIS — M25511 Pain in right shoulder: Secondary | ICD-10-CM | POA: Insufficient documentation

## 2023-06-04 MED ORDER — INSULIN SYRINGE-NEEDLE U-100 27G X 1/2" 0.5 ML MISC: 5 refills | Status: DC

## 2023-06-04 MED ORDER — TESTOSTERONE CYPIONATE 200 MG/ML IM SOLN: INTRAMUSCULAR | 5 refills | Status: DC

## 2023-06-04 NOTE — Telephone Encounter (Signed)
 Called patient 2X to get scheduled per Dr Pete Glatter:  "Please put patient on my schedule in the next 7-10 days to learn testosterone injection procedure."

## 2023-06-04 NOTE — Telephone Encounter (Signed)
 Spoke with patient regarding missed PT appointment. He stated he forgot about the appointment. He was reminded of his next scheduled appointment and advised of attendance policy. Patient expressed understanding.   Rosana Hoes, PT, DPT, LAT, ATC 06/04/23  3:20 PM Phone: [phone] Fax: [phone]

## 2023-06-06 ENCOUNTER — Ambulatory Visit (INDEPENDENT_AMBULATORY_CARE_PROVIDER_SITE_OTHER): Payer: MEDICAID | Admitting: Adult Health

## 2023-06-06 ENCOUNTER — Encounter: Payer: Self-pay | Admitting: Adult Health

## 2023-06-06 DIAGNOSIS — F17210 Nicotine dependence, cigarettes, uncomplicated: Secondary | ICD-10-CM | POA: Diagnosis not present

## 2023-06-06 NOTE — Progress Notes (Signed)
 Spoke with pt, verified name and DOB. Started SDM visit, pt has decided not to complete lung cancer screening scan at this time. Discussed risks and benefits, importance of screening scan and health maintenance. States he "does not feel like doing it right now". Pt hung up on me.

## 2023-06-07 ENCOUNTER — Ambulatory Visit: Payer: MEDICAID | Admitting: Physical Therapy

## 2023-06-07 ENCOUNTER — Encounter: Payer: Self-pay | Admitting: Physical Therapy

## 2023-06-07 ENCOUNTER — Ambulatory Visit: Admitting: Urology

## 2023-06-07 DIAGNOSIS — M6281 Muscle weakness (generalized): Secondary | ICD-10-CM

## 2023-06-07 DIAGNOSIS — G8929 Other chronic pain: Secondary | ICD-10-CM

## 2023-06-07 NOTE — Therapy (Addendum)
  OUTPATIENT PHYSICAL THERAPY TREATMENT NOTE/DISCHARGE  PHYSICAL THERAPY DISCHARGE SUMMARY  Visits from Start of Care: 2  Current functional level related to goals / functional outcomes: See goals/objective   Remaining deficits: Unable to assess   Education / Equipment: HEP   Patient agrees to discharge. Patient goals were unable to assess. Patient is being discharged due to not returning since the last visit.     Patient Name: [name] MRN: [id number] DOB:08-Mar-1971, 53 y.o., male Today's Date: 06/07/2023  END OF SESSION:  PT End of Session - 06/07/23 1108     Visit Number 2    Number of Visits 17    Date for PT Re-Evaluation 07/17/23    Authorization Type [employer]- submitted    PT Start Time 1110    PT Stop Time 1113    PT Time Calculation (min) 3 min            Past Medical History:  Diagnosis Date   Abuse, drug or alcohol (HCC)    Anxiety    Asthma    Closed nondisplaced fracture of head of left radius 12/13/2021   Depression    Hx of opioid abuse (HCC) 04/03/2023   Past Surgical History:  Procedure Laterality Date   IR RADIOLOGIST EVAL & MGMT  03/16/2020   Patient Active Problem List   Diagnosis Date Noted   Mild intermittent asthma without complication 05/14/2023   Need for shingles vaccine 05/14/2023   Acute pain of right knee 05/01/2023   Low testosterone in male 05/01/2023   Colon cancer screening 05/01/2023   Smoker 04/03/2023   Chronic right shoulder pain 04/03/2023   Encounter for screening and preventative care 04/03/2023   PCP: Jolanda Nation, NP  REFERRING PROVIDER: Jolanda Nation, NP  REFERRING DIAG: [phone] (ICD-10-CM) - Chronic right shoulder pain   THERAPY DIAG:  Chronic right shoulder pain  Muscle weakness (generalized)  Rationale for Evaluation and Treatment: Rehabilitation  ONSET DATE: Chronic  SUBJECTIVE:                                                                                                                                                                                       SUBJECTIVE STATEMENT: Pt declines treatment today due to being scheduled with a different provider.    Gasper Karst, PTA 06/07/23 11:21 AM Phone: [phone] Fax: [phone]

## 2023-06-11 ENCOUNTER — Ambulatory Visit (INDEPENDENT_AMBULATORY_CARE_PROVIDER_SITE_OTHER): Payer: MEDICAID | Admitting: Urology

## 2023-06-11 ENCOUNTER — Encounter: Payer: Self-pay | Admitting: Urology

## 2023-06-11 VITALS — BP 147/78 | HR 87 | Ht 69.0 in | Wt 205.0 lb

## 2023-06-11 DIAGNOSIS — E291 Testicular hypofunction: Secondary | ICD-10-CM | POA: Diagnosis not present

## 2023-06-11 MED ORDER — INSULIN SYRINGE-NEEDLE U-100 27G X 1/2" 0.5 ML MISC: 5 refills | Status: AC

## 2023-06-11 MED ORDER — TESTOSTERONE CYPIONATE 200 MG/ML IM SOLN: INTRAMUSCULAR | 5 refills | Status: DC

## 2023-06-11 NOTE — Patient Instructions (Signed)
 Subcutaneous Testosterone Injection: Patient Instructions  Purpose of the Injection: Testosterone injections are used to treat low testosterone levels. This medication is injected under the skin (subcutaneously) and works to restore normal testosterone levels in the body.  Before You Start: [facility] your hands thoroughly with soap and water. - Prepare your supplies:    -  Testosterone vial or prefilled syringe.    -  A syringe with a needle (typically 1 mL, with a 25-27 gauge, 5/8 inch needle).    -  Alcohol swabs.    -  A sterile gauze pad or cotton ball for after the injection.  Injection Site: -  Choose an area of fatty tissue, usually in the upper thigh or abdomen (about 2 inches away from the navel). -  Rotate injection sites to avoid irritation and damage to the tissue.  Steps to Administer the Injection:  Prepare the syringe:  -  If using a vial: Clean the rubber top of the vial with an alcohol swab. Draw air into the syringe equal to the dose of testosterone you will be injecting. -  Insert the needle into the vial and push the air into the vial (this helps prevent a vacuum). Then, draw the prescribed amount of testosterone into the syringe. -  Check for air bubbles: Hold the syringe with the needle pointing up and tap gently to release any air. Push the plunger to remove air bubbles and ensure the correct amount of  medication is in the syringe. 2.  Clean the injection site:       -  Use an alcohol swab to clean the skin at the injection site. Let the skin dry before injecting. 3.  Inject the medication:       -  Pinch the skin at the injection site to create a fold (this makes it easier to insert the needle).       -  Insert the needle at a 45-degree angle into the skin (or as instructed by your healthcare provider).       -  Push the plunger slowly to inject the medication. Take about 10 seconds for the injection to ensure a smooth delivery. 4.  Withdraw the needle:       -   After the injection is complete, withdraw the needle quickly and apply pressure to the site with a sterile gauze or cotton ball to stop any bleeding. 5.  Dispose of the needle and syringe:       -  Place the used needle and syringe in a sharps container immediately. 6.  Massage the site:       -  Gently massage the injection site for a minute to help distribute the medication and reduce discomfort.   Post-Injection Care: -  Monitor for side effects: Common side effects may include redness, swelling, or irritation at the injection site. Notify your healthcare provider if you experience unusual pain, bruising, or other concerning symptoms. -  If you have any signs of infection (increased redness, warmth, or pus at the site), contact your healthcare provider.  Storage: - Store the testosterone medication at room temperature away from direct heat or light. - Do not freeze the medication. - Keep the medication out of reach of children.  When to Contact Your Healthcare Provider: -  If you miss a dose or need assistance with injection technique. -  If you experience any severe reactions, such as swelling, chest pain, or difficulty breathing.  Safety Precautions: -  Only inject the prescribed amount of medication. -  Avoid sharing syringes or needles with others to prevent infection. -  Ensure you are comfortable with your injection technique and always follow any specific instructions from your healthcare provider.

## 2023-06-11 NOTE — Addendum Note (Signed)
 Addended by: Milderd Meager on: 06/11/2023 10:22 AM   Modules accepted: Orders

## 2023-06-11 NOTE — Progress Notes (Signed)
   Assessment: 1. Hypogonadism in male     Plan: Begin testosterone cypionate 0.25 ml [place] on Monday and Friday. Information on injection procedure provided. Again discussed the potential risk and side effects of testosterone replacement therapy. Repeat testosterone level in 6 weeks - draw on Wednesday  Chief Complaint:  Chief Complaint  Patient presents with   Hypogonadism    History of Present Illness:  [name] is a 53 y.o. male who is seen for further evaluation of hypogonadism. He has had symptoms of fatigue, decreased energy, decreased libido x 18 month.  He has not experienced problems with ED. ADAM score = 9/10  (no loss of height)  Labs from 1/25: Testosterone 136.16 PSA  0.81  He is not interested in maintaining his fertility.  No LUTS.  No dysuria or gross hematuria. Labs from 3/25: Testosterone 133 LH  26.7 Prolactin 10 Estradiol 15.8  He presents today for instructions on subcutaneous injection technique. He reports that he is actually done 2 injections at home after watching a YouTube video.  He did not have any problems with the injections.  His last injection was on 06/06/2023.  Portions of the above documentation were copied from a prior visit for review purposes only.   Past Medical History:  Past Medical History:  Diagnosis Date   Abuse, drug or alcohol (HCC)    Anxiety    Asthma    Closed nondisplaced fracture of head of left radius 12/13/2021   Depression    Hx of opioid abuse (HCC) 04/03/2023    Past Surgical History:  Past Surgical History:  Procedure Laterality Date   IR RADIOLOGIST EVAL & MGMT  03/16/2020    Allergies:  No Known Allergies  Family History:  Family History  Problem Relation Age of Onset   Cancer Father        Lung   Prostate cancer Neg Hx    Bladder Cancer Neg Hx    Kidney cancer Neg Hx     Social History:  Social History   Tobacco Use   Smoking status: Every Day    Current packs/day: 2.00     Average packs/day: 2.0 packs/day for 32.9 years (65.8 ttl pk-yrs)    Types: Cigarettes    Start date: 07/25/1990   Smokeless tobacco: Never  Vaping Use   Vaping status: Never Used  Substance Use Topics   Alcohol use: Not Currently   Drug use: Not Currently    Types: Methamphetamines, Cocaine, Other-see comments    Comment: opiods.    ROS: Constitutional:  Negative for fever, chills, weight loss CV: Negative for chest pain, previous MI, hypertension Respiratory:  Negative for shortness of breath, wheezing, sleep apnea, frequent cough GI:  Negative for nausea, vomiting, bloody stool, GERD  Physical exam: BP (!) 147/78   Pulse 87   Ht 5\' 9"  (1.753 m)   Wt 205 lb (93 kg)   BMI 30.27 kg/m  GENERAL APPEARANCE:  Well appearing, well developed, well nourished, NAD HEENT:  Atraumatic, normocephalic, oropharynx clear NECK:  Supple without lymphadenopathy or thyromegaly ABDOMEN:  Soft, non-tender, no masses EXTREMITIES:  Moves all extremities well, without clubbing, cyanosis, or edema NEUROLOGIC:  Alert and oriented x 3, normal gait, CN II-XII grossly intact MENTAL STATUS:  appropriate BACK:  Non-tender to palpation, No CVAT SKIN:  Warm, dry, and intact   Results: None

## 2023-06-12 ENCOUNTER — Other Ambulatory Visit

## 2023-06-16 NOTE — Progress Notes (Unsigned)
   Bertil Brickey T. Mega Kinkade, MD, CAQ Sports Medicine [facility] at [facility] [address] [place], [address]  Phone: [phone]  FAX: [phone]  ELLISON RIETH - 53 y.o. male  MRN [id number]  Date of Birth: [birth year]  Date: 06/19/2023  PCP: Modesto Charon, NP  Referral: Modesto Charon, NP  No chief complaint on file.  Subjective:   BLAS RICHES is a 53 y.o. very pleasant male patient with There is no height or weight on file to calculate BMI. who presents with the following:  Patient presents with ongoing shoulder and knee pain, he is here to have consultation regarding ongoing joint pains.  I did review the patient's shoulder x-ray series independently, and he has advanced glenohumeral joint arthritis.  There is significant osteophytosis and quite a bit of subchondral sclerosis.  Mild acromioclavicular joint arthritis.  Right sided knee x-ray looks essentially normal.    Review of Systems is noted in the HPI, as appropriate  Objective:   There were no vitals taken for this visit.  GEN: No acute distress; alert,appropriate. PULM: Breathing comfortably in no respiratory distress PSYCH: Normally interactive.   Laboratory and Imaging Data:  Assessment and Plan:   ***

## 2023-06-19 ENCOUNTER — Other Ambulatory Visit: Payer: Self-pay | Admitting: Urology

## 2023-06-19 ENCOUNTER — Encounter: Payer: Self-pay | Admitting: Family Medicine

## 2023-06-19 ENCOUNTER — Ambulatory Visit (INDEPENDENT_AMBULATORY_CARE_PROVIDER_SITE_OTHER): Payer: MEDICAID | Admitting: Family Medicine

## 2023-06-19 VITALS — BP 110/70 | HR 65 | Temp 97.8°F | Ht 69.5 in | Wt 201.5 lb

## 2023-06-19 DIAGNOSIS — M25511 Pain in right shoulder: Secondary | ICD-10-CM

## 2023-06-19 DIAGNOSIS — G8929 Other chronic pain: Secondary | ICD-10-CM | POA: Diagnosis not present

## 2023-06-19 DIAGNOSIS — M19011 Primary osteoarthritis, right shoulder: Secondary | ICD-10-CM

## 2023-06-19 DIAGNOSIS — M25561 Pain in right knee: Secondary | ICD-10-CM

## 2023-06-19 MED ORDER — TRIAMCINOLONE ACETONIDE 40 MG/ML IJ SUSP
40.0000 mg | Freq: Once | INTRAMUSCULAR | Status: AC
  Administered 2023-06-19: 40 mg via INTRA_ARTICULAR

## 2023-07-04 ENCOUNTER — Encounter: Payer: Self-pay | Admitting: General Practice

## 2023-07-04 ENCOUNTER — Other Ambulatory Visit: Payer: Self-pay | Admitting: General Practice

## 2023-07-04 DIAGNOSIS — Z1389 Encounter for screening for other disorder: Secondary | ICD-10-CM

## 2023-07-10 ENCOUNTER — Other Ambulatory Visit: Payer: Self-pay | Admitting: General Practice

## 2023-07-10 DIAGNOSIS — J302 Other seasonal allergic rhinitis: Secondary | ICD-10-CM

## 2023-07-15 ENCOUNTER — Ambulatory Visit: Admitting: Family

## 2023-07-17 NOTE — Telephone Encounter (Signed)
 Please sent patients referral to [facility] dermatology

## 2023-07-18 NOTE — Telephone Encounter (Signed)
 Referral faxed to [facility] as requested.  MyChart message sent to patient.

## 2023-07-19 NOTE — Telephone Encounter (Signed)
 Please have forms filled out by the referring provider.  Thanks

## 2023-07-19 NOTE — Telephone Encounter (Signed)
 We do not have medicaid referral forms in the office; not sure how to fill this out as we never do these.

## 2023-07-19 NOTE — Telephone Encounter (Signed)
 [facility] sent notice that medicad form needs to be filled out and faxed to them at [phone].

## 2023-07-24 ENCOUNTER — Other Ambulatory Visit

## 2023-07-24 DIAGNOSIS — E291 Testicular hypofunction: Secondary | ICD-10-CM

## 2023-07-24 NOTE — Telephone Encounter (Signed)
 Sorry, our department does not handle any forms.  Maybe try calling the insurance or going online?

## 2023-07-24 NOTE — Telephone Encounter (Signed)
Form has been received and is being worked on.

## 2023-07-25 ENCOUNTER — Encounter: Payer: Self-pay | Admitting: Urology

## 2023-07-25 LAB — TESTOSTERONE: Testosterone: 322 ng/dL (ref 264–916)

## 2023-07-25 MED ORDER — TESTOSTERONE CYPIONATE 200 MG/ML IM SOLN: INTRAMUSCULAR | 4 refills | Status: DC

## 2023-07-29 ENCOUNTER — Other Ambulatory Visit: Payer: Self-pay | Admitting: Urology

## 2023-07-29 DIAGNOSIS — E291 Testicular hypofunction: Secondary | ICD-10-CM

## 2023-07-31 ENCOUNTER — Ambulatory Visit (INDEPENDENT_AMBULATORY_CARE_PROVIDER_SITE_OTHER): Payer: MEDICAID | Admitting: General Practice

## 2023-07-31 ENCOUNTER — Encounter: Payer: Self-pay | Admitting: General Practice

## 2023-07-31 ENCOUNTER — Ambulatory Visit (INDEPENDENT_AMBULATORY_CARE_PROVIDER_SITE_OTHER)
Admission: RE | Admit: 2023-07-31 | Discharge: 2023-07-31 | Disposition: A | Discharge status: Home / Self Care | Payer: MEDICAID | Source: Ambulatory Visit | Attending: General Practice | Admitting: General Practice

## 2023-07-31 VITALS — BP 124/70 | HR 80 | Temp 98.3°F | Ht 69.5 in | Wt 195.0 lb

## 2023-07-31 DIAGNOSIS — F17200 Nicotine dependence, unspecified, uncomplicated: Secondary | ICD-10-CM

## 2023-07-31 DIAGNOSIS — B001 Herpesviral vesicular dermatitis: Secondary | ICD-10-CM | POA: Diagnosis not present

## 2023-07-31 DIAGNOSIS — M25521 Pain in right elbow: Secondary | ICD-10-CM

## 2023-07-31 DIAGNOSIS — Z23 Encounter for immunization: Secondary | ICD-10-CM | POA: Diagnosis not present

## 2023-07-31 MED ORDER — VALACYCLOVIR HCL 500 MG PO TABS: 500.0000 mg | ORAL_TABLET | Freq: Two times a day (BID) | ORAL | 0 refills | Status: DC

## 2023-07-31 NOTE — Patient Instructions (Addendum)
 Complete xray(s) prior to leaving today. I will notify you of your results once received.  I will be in touch with the treatment plan once I have looked at the x-ray.   Valacyclovir 500 mg twice a day for seven days has been sent to the pharmacy for next flare up.   It was a pleasure to see you today!

## 2023-07-31 NOTE — Assessment & Plan Note (Signed)
 Smoking cessation instruction/counseling given:  counseled patient on the dangers of tobacco use, advised patient to stop smoking, and reviewed strategies to maximize success  Declines lung cancer screening.

## 2023-07-31 NOTE — Assessment & Plan Note (Signed)
 Unclear etiology.  Differentials include OA, muscle sprain Limited ROM on exam.   X-ray pending.  Await results.

## 2023-07-31 NOTE — Addendum Note (Signed)
 Addended by: Clarance Cristal B on: 07/31/2023 11:51 AM   Modules accepted: Orders

## 2023-07-31 NOTE — Progress Notes (Signed)
 Established Patient Office Visit  Subjective   Patient ID: [name], male    DOB: [birth year]  Age: 53 y.o. MRN: [id number]  Chief Complaint  Patient presents with   Mouth Lesions    Patient gets them when he gets stressed. He had one but got better now.    Elbow Pain    Right elbow is worse but both hurt like tennis elbow. Patient states this has been going on for about 2-3 months.     HPI  Boss is a 53 year old male with past medical history of asthma, tobacco abuse, low testosterone  presents today for an acute visit.  Herpes Labialis: chronic. Usually gets flare up when he gets stressed. He gets a flare up 2-3 times a year. [facility] Valtrex 500 mg BID x 2 days.   Right elbow pain: started two-three months ago. Doesn't recall any injury or fall. He broke his left humerus two years ago. He suspects that he is over compensating his right elbow while working out. He describes pain is nagging. He has difficulty with rotating his elbow. He has not tried any ice or heat or medication.   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   Right elbow pain 07/31/2023   Mild intermittent asthma without complication 05/14/2023   Need for shingles vaccine 05/14/2023   Acute pain of right knee 05/01/2023   Low testosterone  in male 05/01/2023   Colon cancer screening 05/01/2023   Smoker 04/03/2023   Chronic right shoulder pain 04/03/2023   Encounter for screening and preventative care 04/03/2023   Past Medical History:  Diagnosis Date   Abuse, drug or alcohol (HCC)    Anxiety    Asthma    Closed nondisplaced fracture of head of left radius 12/13/2021   Depression    Hx of opioid abuse (HCC) 04/03/2023   Past Surgical History:  Procedure Laterality Date   IR RADIOLOGIST EVAL & MGMT  03/16/2020   No Known Allergies       07/31/2023   11:12 AM 05/24/2023    2:24 PM 05/14/2023   10:59 AM  Depression  screen PHQ 2/9  Decreased Interest 0 0 0  Down, Depressed, Hopeless 0 0 0  PHQ - 2 Score 0 0 0  Altered sleeping 0 0 0  Tired, decreased energy 0 0 0  Change in appetite 0 0 0  Feeling bad or failure about yourself  0 0 0  Trouble concentrating 0 0 0  Moving slowly or fidgety/restless 0 0 0  Suicidal thoughts 0 0 0  PHQ-9 Score 0 0 0  Difficult doing work/chores Not difficult at all Not difficult at all Not difficult at all       05/14/2023   10:59 AM 05/01/2023   11:32 AM 04/03/2023   10:48 AM  GAD 7 : Generalized Anxiety Score  Nervous, Anxious, on Edge 0 0 0  Control/stop worrying 0 0 0  Worry too much - different things 0 0 0  Trouble relaxing 0 0 0  Restless 0 0 2  Easily annoyed or irritable 0 0 2  Afraid - awful might happen 0 0 0  Total GAD 7 Score 0 0 4  Anxiety Difficulty Not difficult at all Not difficult at all Somewhat difficult      Review of Systems  Constitutional:  Negative for chills and fever.  Respiratory:  Negative for  shortness of breath.   Cardiovascular:  Negative for chest pain.  Gastrointestinal:  Negative for abdominal pain, constipation, diarrhea, heartburn, nausea and vomiting.  Genitourinary:  Negative for dysuria, frequency and urgency.  Musculoskeletal:  Positive for joint pain.  Neurological:  Negative for dizziness and headaches.  Endo/Heme/Allergies:  Negative for polydipsia.  Psychiatric/Behavioral:  Negative for depression and suicidal ideas. The patient is not nervous/anxious.       Objective:     BP 124/70 (BP Location: Left Arm, Patient Position: Sitting, Cuff Size: Normal)   Pulse 80   Temp 98.3 F (36.8 C) (Oral)   Ht 5' 9.5" (1.765 m)   Wt 195 lb (88.5 kg)   SpO2 94%   BMI 28.38 kg/m  BP Readings from Last 3 Encounters:  07/31/23 124/70  06/19/23 110/70  06/11/23 (!) 147/78   Wt Readings from Last 3 Encounters:  07/31/23 195 lb (88.5 kg)  06/19/23 201 lb 8 oz (91.4 kg)  06/11/23 205 lb (93 kg)      Physical  Exam Vitals and nursing note reviewed.  Constitutional:      Appearance: Normal appearance.  Cardiovascular:     Rate and Rhythm: Normal rate and regular rhythm.     Pulses: Normal pulses.     Heart sounds: Normal heart sounds.  Pulmonary:     Effort: Pulmonary effort is normal.     Breath sounds: Normal breath sounds.  Musculoskeletal:        General: No swelling.     Right elbow: No swelling, deformity, effusion or lacerations. Decreased range of motion. No tenderness.     Left elbow: No swelling, deformity, effusion or lacerations. Normal range of motion. No tenderness.     Comments: Right elbow pain.  Neurological:     Mental Status: He is alert and oriented to person, place, and time.  Psychiatric:        Mood and Affect: Mood normal.        Behavior: Behavior normal.        Thought Content: Thought content normal.        Judgment: Judgment normal.      No results found for any visits on 07/31/23.     The 10-year ASCVD risk score (Arnett DK, et al., 2019) is: 7.2%    Assessment & Plan:  Recurrent herpes labialis Assessment & Plan: Recurrent.  No lesions on exam today.   Will give him PRN Valtrex 500 mg BID x 7 days for his next flare up.   Orders: -     valACYclovir HCl; Take 1 tablet (500 mg total) by mouth 2 (two) times daily. For cold sore flare up.  Dispense: 14 tablet; Refill: 0  Right elbow pain Assessment & Plan: Unclear etiology.  Differentials include OA, muscle sprain Limited ROM on exam.   X-ray pending.  Await results.  Orders: -     DG ELBOW COMPLETE RIGHT (3+VIEW)  Need for shingles vaccine  Smoker Assessment & Plan: Smoking cessation instruction/counseling given:  counseled patient on the dangers of tobacco use, advised patient to stop smoking, and reviewed strategies to maximize success  Declines lung cancer screening.      Return in about 3 months (around 10/31/2023) for chronic care management.Jolanda Nation, NP

## 2023-07-31 NOTE — Assessment & Plan Note (Signed)
 Recurrent.  No lesions on exam today.   Will give him PRN Valtrex 500 mg BID x 7 days for his next flare up.

## 2023-08-01 ENCOUNTER — Encounter: Payer: Self-pay | Admitting: General Practice

## 2023-08-01 DIAGNOSIS — G8929 Other chronic pain: Secondary | ICD-10-CM

## 2023-08-09 ENCOUNTER — Other Ambulatory Visit: Payer: Self-pay | Admitting: Urology

## 2023-08-09 MED ORDER — TADALAFIL 20 MG PO TABS: 20.0000 mg | ORAL_TABLET | Freq: Every day | ORAL | 11 refills | Status: DC | PRN

## 2023-08-13 NOTE — Progress Notes (Deleted)
     [name] T. Gregory Myer, MD, CAQ Sports Medicine [facility] at [facility] [address] [place], [address]  Phone: [phone]  FAX: [phone]  [name] - 53 y.o. male  MRN [id number]  Date of Birth: Oct 01, 1970  Date: 08/14/2023  PCP: Jolanda Nation, NP  Referral: Jolanda Nation, NP  No chief complaint on file.  Subjective:   [name] is a 53 y.o. very pleasant male patient with There is no height or weight on file to calculate BMI. who presents with the following:  I previously saw the patient for some right-sided shoulder pain, and he had remarkably bad glenohumeral arthritis for age.  He has been having some right-sided elbow pain and probable tennis elbow.    Review of Systems is noted in the HPI, as appropriate  Objective:   There were no vitals taken for this visit.  GEN: No acute distress; alert,appropriate. PULM: Breathing comfortably in no respiratory distress PSYCH: Normally interactive.   Laboratory and Imaging Data:  Assessment and Plan:   ***

## 2023-08-14 ENCOUNTER — Ambulatory Visit: Payer: MEDICAID | Admitting: Family Medicine

## 2023-08-14 ENCOUNTER — Telehealth: Payer: Self-pay

## 2023-08-14 NOTE — Telephone Encounter (Signed)
 Patient had also sent [place] a MyChart message.  Dr. Tasia Farr recommendations sent to patient via MyChart so patient could have written instructions.

## 2023-08-14 NOTE — Telephone Encounter (Signed)
 Ok to call back to answer his question  I would use Alleve 2 tabs by mouth two times a day over the counter:  Take Tylenol /Acetaminophen  ES (500mg ) 2 tabs by mouth three times a day max as needed.   Voltaren gel up to 4 times a day

## 2023-08-14 NOTE — Telephone Encounter (Signed)
 Called pt to reschedule his appointment.  Scheduled today 08/14/23 for a right shoulder injection but it is too soon. His last shoulder injection was 06/19/23 and he must have 3 months between shoulder injections.  Writer attempted to reschedule appointment but pt reported that he would call back and schedule at a later time.    Before hanging up pt asked what he could do for the pain since he is not able to receive another injection at this time.

## 2023-08-26 ENCOUNTER — Other Ambulatory Visit: Payer: Self-pay

## 2023-08-26 DIAGNOSIS — R7989 Other specified abnormal findings of blood chemistry: Secondary | ICD-10-CM

## 2023-08-26 DIAGNOSIS — E291 Testicular hypofunction: Secondary | ICD-10-CM

## 2023-08-28 ENCOUNTER — Ambulatory Visit (INDEPENDENT_AMBULATORY_CARE_PROVIDER_SITE_OTHER): Payer: MEDICAID | Admitting: Orthopedic Surgery

## 2023-08-28 VITALS — BP 128/74 | Ht 69.0 in | Wt 192.0 lb

## 2023-08-28 DIAGNOSIS — M19011 Primary osteoarthritis, right shoulder: Secondary | ICD-10-CM

## 2023-08-28 MED ORDER — GABAPENTIN 100 MG PO CAPS: 100.0000 mg | ORAL_CAPSULE | Freq: Three times a day (TID) | ORAL | 0 refills | Status: DC

## 2023-08-28 NOTE — Progress Notes (Unsigned)
 New Patient Visit  Assessment: [name] is a 53 y.o. male with the following: Right glenohumeral arthritis  Plan: ZALYN AMEND has severe right glenohumeral joint arthritis.  Currently he has excellent ROM and strength.  Recent injection was helpful.  He will likely benefit from shoulder arthroplasty at some point in the future, but his function is very good.  Can treat symptoms as needed.  Too early to repeat an injection.  Follow up in a month or more if interested in another injection.  Follow-up: No follow-ups on file.  Subjective:  Chief Complaint  Patient presents with   Shoulder Pain    Having this pain 2-3 years but now gets numb and tingles, has ROM  shooting pain going to the to the forearm  no injuries, was taking ibuprofen   now taking bc powder and Aleve     History of Present Illness: [name] is a 53 y.o. male who has been referred by  Jolanda Nation, NP for evaluation of right shoulder pain.  He has had progressively worsening pain in the right shoulder for the past couple years.  No specific injury.  Pain is not radiating distally.  Occasionally he gets some numbness and tingling.  He also has some pain around the elbow.  He takes [employer], which are effective.  He has also tried naproxen.  Approximate 2 months ago, he had an intra-articular steroid injection, which was very helpful.  That injection has since worn off.   Review of Systems: No fevers or chills No numbness or tingling No chest pain No shortness of breath No bowel or bladder dysfunction No GI distress No headaches   Medical History:  Past Medical History:  Diagnosis Date   Abuse, drug or alcohol (HCC)    Anxiety    Asthma    Closed nondisplaced fracture of head of left radius 12/13/2021   Depression    Hx of opioid abuse (HCC) 04/03/2023    Past Surgical History:  Procedure Laterality Date   IR RADIOLOGIST EVAL & MGMT  03/16/2020    Family History  Problem Relation Age  of Onset   Cancer Father        Lung   Prostate cancer Neg Hx    Bladder Cancer Neg Hx    Kidney cancer Neg Hx    Social History   Tobacco Use   Smoking status: Every Day    Current packs/day: 2.00    Average packs/day: 2.0 packs/day for 33.1 years (66.2 ttl pk-yrs)    Types: Cigarettes    Start date: 07/25/1990   Smokeless tobacco: Never  Vaping Use   Vaping status: Never Used  Substance Use Topics   Alcohol use: Not Currently   Drug use: Not Currently    Types: Methamphetamines, Cocaine, Other-see comments    Comment: opiods.    No Known Allergies  Current Meds  Medication Sig   albuterol  (VENTOLIN  HFA) 108 (90 Base) MCG/ACT inhaler Inhale 2 puffs into the lungs every 6 (six) hours as needed for wheezing or shortness of breath.   Amino Acids (AMINO ACID PO) Take by mouth.   Collagen-Vitamin C-Biotin (COLLAGEN PO) Take by mouth.   CREATINE PO Take by mouth.   fluticasone  (FLONASE ) 50 MCG/ACT nasal spray PLACE TWO SPRAYS INTO BOTH NOSTRILS DAILY.   gabapentin  (NEURONTIN ) 100 MG capsule Take 1 capsule (100 mg total) by mouth 3 (three) times daily.   Insulin  Syringe-Needle U-100 27G X 1/2" 0.5 ML MISC Use as  directed   Magnesium 80 MG TABS Take by mouth.   Melatonin-Theanine (MELATONIN CALM SLEEP PO) Take by mouth.   PROTEIN PO Take by mouth.   tadalafil  (CIALIS ) 20 MG tablet Take 1 tablet (20 mg total) by mouth daily as needed.   testosterone  cypionate (DEPOTESTOSTERONE CYPIONATE) 200 MG/ML injection Inject 0.40 ml subcutaneously on Monday and Friday   valACYclovir  (VALTREX ) 500 MG tablet Take 1 tablet (500 mg total) by mouth 2 (two) times daily. For cold sore flare up.   Vit D-Ca Beta Hydrox Beta Meth (HMB PRO PO) Take by mouth.   VITAMIN D, CHOLECALCIFEROL, PO Take by mouth.    Objective: BP 128/74   Ht 5\' 9"  (1.753 m)   Wt 192 lb (87.1 kg)   BMI 28.35 kg/m   Physical Exam:  General: Alert and oriented. and No acute distress. Gait: Normal gait.  Evaluation of  the right shoulder demonstrates no deformity.  There is crepitus with range of motion.  He has full forward flexion.  Full abduction.  External rotation is similar to the contralateral side.  He has excellent strength overall.  Sensation intact throughout the right upper extremity.  IMAGING: I personally reviewed images previously obtained in clinic    X-rays of the right shoulder were previously obtained.  Advanced degenerative changes.  Osteophytes within the glenohumeral joint.  Moderate to severe overall.   New Medications:  Meds ordered this encounter  Medications   gabapentin  (NEURONTIN ) 100 MG capsule    Sig: Take 1 capsule (100 mg total) by mouth 3 (three) times daily.    Dispense:  90 capsule    Refill:  0      Tonita Frater, MD  08/29/2023 9:45 PM

## 2023-08-29 ENCOUNTER — Encounter: Payer: Self-pay | Admitting: Orthopedic Surgery

## 2023-09-04 ENCOUNTER — Other Ambulatory Visit: Payer: MEDICAID

## 2023-09-04 DIAGNOSIS — E291 Testicular hypofunction: Secondary | ICD-10-CM

## 2023-09-04 DIAGNOSIS — R7989 Other specified abnormal findings of blood chemistry: Secondary | ICD-10-CM

## 2023-09-05 ENCOUNTER — Ambulatory Visit: Payer: Self-pay | Admitting: Urology

## 2023-09-05 ENCOUNTER — Ambulatory Visit: Payer: MEDICAID | Admitting: General Practice

## 2023-09-05 LAB — TESTOSTERONE: Testosterone: 806 ng/dL (ref 264–916)

## 2023-09-06 ENCOUNTER — Ambulatory Visit
Admission: RE | Admit: 2023-09-06 | Discharge: 2023-09-06 | Disposition: A | Discharge status: Home / Self Care | Payer: MEDICAID | Source: Ambulatory Visit | Attending: General Practice | Admitting: General Practice

## 2023-09-06 ENCOUNTER — Telehealth: Payer: Self-pay

## 2023-09-06 ENCOUNTER — Ambulatory Visit (INDEPENDENT_AMBULATORY_CARE_PROVIDER_SITE_OTHER): Payer: MEDICAID | Admitting: General Practice

## 2023-09-06 ENCOUNTER — Encounter: Payer: Self-pay | Admitting: General Practice

## 2023-09-06 VITALS — BP 116/64 | HR 78 | Temp 98.1°F | Ht 69.0 in | Wt 189.0 lb

## 2023-09-06 DIAGNOSIS — M25561 Pain in right knee: Secondary | ICD-10-CM

## 2023-09-06 DIAGNOSIS — J302 Other seasonal allergic rhinitis: Secondary | ICD-10-CM | POA: Diagnosis not present

## 2023-09-06 MED ORDER — MELOXICAM 7.5 MG PO TABS: 7.5000 mg | ORAL_TABLET | Freq: Every day | ORAL | 0 refills | Status: DC

## 2023-09-06 MED ORDER — FLUTICASONE PROPIONATE 50 MCG/ACT NA SUSP
2.0000 | Freq: Every day | NASAL | 2 refills | Status: DC
Start: 2023-09-06 — End: 2023-11-19

## 2023-09-06 NOTE — Patient Instructions (Addendum)
 Complete xray(s) prior to leaving today. I will notify you of your results once received.  Schedule appt with ortho if not better.   Start Meloxicam 7.5 mg as needed for pain.   It was a pleasure to see you today!

## 2023-09-06 NOTE — Progress Notes (Signed)
 Established Patient Office Visit  Subjective   Patient ID: [name], male    DOB: [birth year]  Age: 53 y.o. MRN: [id number]  Chief Complaint  Patient presents with   Right Knee and Leg Pain    Started about 1 week ago due to injury.     HPI  [name] is 53 year old male with past medical history of asthma, chronic shoulder pain presents today for an acute visit.   Right knee pain: last week; his knee popped and was sore. He has been able to put weight on it. Pain radiating to his calf down to his ankle. He has limited ROM with rotation. He denies any swelling or redness. He works out daily and he has trying to stay off of his ankle. He had an x-ray of the right knee on 05/06/23 which showed no acute osseous or join abnormality or degenerative changes. He has been following with ortho regarding his shoulder as well.   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   Right elbow pain 07/31/2023   Mild intermittent asthma without complication 05/14/2023   Need for shingles vaccine 05/14/2023   Acute pain of right knee 05/01/2023   Low testosterone  in male 05/01/2023   Colon cancer screening 05/01/2023   Smoker 04/03/2023   Chronic right shoulder pain 04/03/2023   Seasonal allergies 04/03/2023   Encounter for screening and preventative care 04/03/2023   Past Medical History:  Diagnosis Date   Abuse, drug or alcohol (HCC)    Anxiety    Asthma    Closed nondisplaced fracture of head of left radius 12/13/2021   Depression    Hx of opioid abuse (HCC) 04/03/2023   Past Surgical History:  Procedure Laterality Date   IR RADIOLOGIST EVAL & MGMT  03/16/2020   No Known Allergies       09/06/2023    2:50 PM 07/31/2023   11:12 AM 05/24/2023    2:24 PM  Depression screen PHQ 2/9  Decreased Interest 0 0 0  Down, Depressed, Hopeless 0 0 0  PHQ - 2 Score 0 0 0  Altered sleeping  0 0  Tired, decreased energy  0 0  Change in  appetite  0 0  Feeling bad or failure about yourself   0 0  Trouble concentrating  0 0  Moving slowly or fidgety/restless  0 0  Suicidal thoughts  0 0  PHQ-9 Score  0 0  Difficult doing work/chores Not difficult at all Not difficult at all Not difficult at all       05/14/2023   10:59 AM 05/01/2023   11:32 AM 04/03/2023   10:48 AM  GAD 7 : Generalized Anxiety Score  Nervous, Anxious, on Edge 0 0 0  Control/stop worrying 0 0 0  Worry too much - different things 0 0 0  Trouble relaxing 0 0 0  Restless 0 0 2  Easily annoyed or irritable 0 0 2  Afraid - awful might happen 0 0 0  Total GAD 7 Score 0 0 4  Anxiety Difficulty Not difficult at all Not difficult at all Somewhat difficult      Review of Systems  Constitutional:  Negative for chills and fever.  Respiratory:  Negative for shortness of breath.   Cardiovascular:  Negative for chest pain.  Gastrointestinal:  Negative for abdominal pain, constipation, diarrhea, heartburn, nausea and vomiting.  Genitourinary:  Negative for dysuria, frequency and urgency.  Musculoskeletal:  Positive for joint pain.  Neurological:  Negative for dizziness and headaches.  Endo/Heme/Allergies:  Negative for polydipsia.  Psychiatric/Behavioral:  Negative for depression and suicidal ideas. The patient is not nervous/anxious.       Objective:     BP 116/64 (BP Location: Left Arm, Patient Position: Sitting, Cuff Size: Large)   Pulse 78   Temp 98.1 F (36.7 C) (Oral)   Ht 5' 9 (1.753 m)   Wt 189 lb (85.7 kg)   SpO2 94%   BMI 27.91 kg/m  BP Readings from Last 3 Encounters:  09/06/23 116/64  08/28/23 128/74  07/31/23 124/70   Wt Readings from Last 3 Encounters:  09/06/23 189 lb (85.7 kg)  08/28/23 192 lb (87.1 kg)  07/31/23 195 lb (88.5 kg)      Physical Exam Vitals and nursing note reviewed.  Constitutional:      Appearance: Normal appearance.   Cardiovascular:     Rate and Rhythm: Normal rate and regular rhythm.     Pulses:  Normal pulses.     Heart sounds: Normal heart sounds.  Pulmonary:     Effort: Pulmonary effort is normal.     Breath sounds: Normal breath sounds.   Musculoskeletal:     Right knee: No swelling or erythema. Decreased range of motion.     Left knee: No swelling or erythema. Normal range of motion.   Neurological:     Mental Status: He is alert and oriented to person, place, and time.   Psychiatric:        Mood and Affect: Mood normal.        Behavior: Behavior normal.        Thought Content: Thought content normal.        Judgment: Judgment normal.      No results found for any visits on 09/06/23.     The 10-year ASCVD risk score (Arnett DK, et al., 2019) is: 6.4%    Assessment & Plan:  Acute pain of right knee Assessment & Plan: Unclear etiology.  Suspect injury while working out.   Discussed repeating imaging and advised patient that it may not show an ACL tear.  Patient would like to proceed with xray.  Discussed rest and ice as needed.  Start meloxicam 7.5 mg as needed for pain. Rx sent.  Await results. Consider f/u with ortho if x-ray is negative.  Orders: -     Meloxicam; Take 1 tablet (7.5 mg total) by mouth daily.  Dispense: 30 tablet; Refill: 0  Seasonal allergies Assessment & Plan: Rx sent for flonase .  Orders: -     Fluticasone  Propionate; Place 2 sprays into both nostrils daily.  Dispense: 16 mL; Refill: 2     Return if symptoms worsen or fail to improve.    Jolanda Nation, NP

## 2023-09-06 NOTE — Assessment & Plan Note (Signed)
Rx sent for flonase

## 2023-09-06 NOTE — Telephone Encounter (Signed)
 Patient refused knee xray, provider notified.

## 2023-09-06 NOTE — Assessment & Plan Note (Addendum)
 Unclear etiology.  Suspect injury while working out.   Discussed repeating imaging and advised patient that it may not show an ACL tear.  Patient would like to proceed with xray.  Discussed rest and ice as needed.  Start meloxicam 7.5 mg as needed for pain. Rx sent.  Await results. Consider f/u with ortho if x-ray is negative.

## 2023-09-09 ENCOUNTER — Telehealth: Payer: Self-pay

## 2023-09-11 ENCOUNTER — Ambulatory Visit (INDEPENDENT_AMBULATORY_CARE_PROVIDER_SITE_OTHER): Payer: MEDICAID | Admitting: Orthopedic Surgery

## 2023-09-11 ENCOUNTER — Encounter: Payer: Self-pay | Admitting: Orthopedic Surgery

## 2023-09-11 DIAGNOSIS — M25561 Pain in right knee: Secondary | ICD-10-CM | POA: Diagnosis not present

## 2023-09-11 DIAGNOSIS — M25571 Pain in right ankle and joints of right foot: Secondary | ICD-10-CM

## 2023-09-11 NOTE — Progress Notes (Signed)
 Orthopaedic Clinic Return  Assessment: [name] is a 53 y.o. male with the following: Right knee pain Right ankle pain  Plan: Popping sensation in the right knee, 2 weeks ago.  Some pain radiating distally. Knee is currently stable, as is the ankle.  No swelling about the knee.  Mild swelling over the ankle.  Medications as needed.  Activities as tolerated.  Avoid high intensity activities until he is feeling better.  Follow-up: Return if symptoms worsen or fail to improve.   Subjective:  Chief Complaint  Patient presents with   Knee Pain    Right knee pain   Ankle Pain    Right ankle pain    History of Present Illness: [name] is a 53 y.o. male who returns to clinic for evaluation of right knee pain and ankle pain.  He reports a popping sensation in his knee, a couple of weeks ago.  This happened when he was working out.  He started with some pain over the anterior lateral aspect of the right knee.  It has radiated distally.  He also has some pain and swelling over the lateral aspect of the right ankle.  He is ambulating well.  He is not taking medicines on a regular basis.  He would like to return to his previous level of activity.  Review of Systems: No fevers or chills No numbness or tingling No chest pain No shortness of breath No bowel or bladder dysfunction No GI distress No headaches   Objective: There were no vitals taken for this visit.  Physical Exam:  Alert and oriented.  No acute distress.  No swelling over the knee, mild swelling over the lateral ankle.  No pain with inversion or eversion of the ankle.  Negative anterior drawer.  He has good range of motion of both the ankle and the knee.  Negative Lachman.  No crease laxity varus or valgus stress.  IMAGING: I personally ordered and reviewed the following images:  No new imaging obtained today.  Oneil DELENA Horde, MD 09/11/2023 4:44 PM

## 2023-09-12 NOTE — Telephone Encounter (Signed)
 Please change ortho referral location as requested by patient.

## 2023-09-18 NOTE — Telephone Encounter (Signed)
 Referral faxed to [facility] per patient request.

## 2023-09-22 NOTE — Progress Notes (Unsigned)
     Gregory Soy T. Jeniya Flannigan, MD, CAQ Sports Medicine [facility] at [facility] [address] [place], [address]  Phone: [phone]  FAX: [phone]  [name] - 53 y.o. male  MRN [id number]  Date of Birth: [birth year]  Date: 09/23/2023  PCP: Vincente Shivers, NP  Referral: Vincente Shivers, NP  No chief complaint on file.  Subjective:   [name] is a 53 y.o. very pleasant male patient with There is no height or weight on file to calculate BMI. who presents with the following:  The patient presents with right knee and ankle pain.  He saw his primary care provider last week.  I saw him previously for right-sided glenohumeral joint arthritis that was quite severe and also gave him an intra-articular injection at that time.  He had a consult with orthopedics regarding his glenohumeral arthritis, as well a few weeks ago.  I looks like he actually has already seen orthopedics for his right knee, as well.  He saw Dr. Onesimo on September 11, 2023.  At that point, orthopedics felt as if his knee and ankle were fairly stable and suggested OTC meds as needed.    Review of Systems is noted in the HPI, as appropriate  Objective:   There were no vitals taken for this visit.  GEN: No acute distress; alert,appropriate. PULM: Breathing comfortably in no respiratory distress PSYCH: Normally interactive.   Laboratory and Imaging Data:  Assessment and Plan:   ***

## 2023-09-23 ENCOUNTER — Ambulatory Visit (INDEPENDENT_AMBULATORY_CARE_PROVIDER_SITE_OTHER): Payer: MEDICAID | Admitting: Family Medicine

## 2023-09-23 ENCOUNTER — Encounter: Payer: Self-pay | Admitting: Family Medicine

## 2023-09-23 VITALS — BP 90/60 | HR 82 | Temp 99.8°F | Ht 69.0 in | Wt 188.2 lb

## 2023-09-23 DIAGNOSIS — M25561 Pain in right knee: Secondary | ICD-10-CM | POA: Diagnosis not present

## 2023-09-23 DIAGNOSIS — M7671 Peroneal tendinitis, right leg: Secondary | ICD-10-CM

## 2023-09-23 DIAGNOSIS — M25571 Pain in right ankle and joints of right foot: Secondary | ICD-10-CM | POA: Diagnosis not present

## 2023-09-23 DIAGNOSIS — M19011 Primary osteoarthritis, right shoulder: Secondary | ICD-10-CM | POA: Diagnosis not present

## 2023-09-23 MED ORDER — TRIAMCINOLONE ACETONIDE 40 MG/ML IJ SUSP
40.0000 mg | Freq: Once | INTRAMUSCULAR | Status: AC
  Administered 2023-09-23: 40 mg via INTRA_ARTICULAR

## 2023-09-23 MED ORDER — GABAPENTIN 100 MG PO CAPS: 100.0000 mg | ORAL_CAPSULE | Freq: Three times a day (TID) | ORAL | 3 refills | Status: DC

## 2023-09-25 ENCOUNTER — Ambulatory Visit: Payer: MEDICAID | Admitting: Orthopedic Surgery

## 2023-10-04 ENCOUNTER — Other Ambulatory Visit: Payer: Self-pay | Admitting: Urology

## 2023-10-04 DIAGNOSIS — E291 Testicular hypofunction: Secondary | ICD-10-CM

## 2023-10-04 MED ORDER — TESTOSTERONE CYPIONATE 200 MG/ML IM SOLN: INTRAMUSCULAR | 1 refills | Status: DC

## 2023-10-07 ENCOUNTER — Ambulatory Visit: Payer: Self-pay

## 2023-10-07 NOTE — Telephone Encounter (Signed)
 PCP not in office; closing note as patient did not want to schedule offered appt.

## 2023-10-07 NOTE — Telephone Encounter (Signed)
 FYI Only or Action Required?: FYI only for provider.  Patient was last seen in primary care on 09/23/2023 by Watt Mirza, MD.  Called Nurse Triage reporting Nasal Congestion.  Symptoms began several days ago.  Interventions attempted: Other: Patient refusing triage.  Symptoms are: gradually worsening.  Triage Disposition: No disposition on file.  Patient/caregiver understands and will follow disposition?: Patient refusing triage, asked when is the next avail appt at the office. This RN advised patient that the next avail at the office is 7/16. Pt hung up abruptly      Copied from CRM [id number]. Topic: Clinical - Red Word Triage >> Oct 07, 2023 11:54 AM Suzen RAMAN wrote: Red Word that prompted transfer to Nurse Triage: congestion, discolored mucous Answer Assessment - Initial Assessment Questions 1. ONSET: When did the cough begin?      3 days  2. SEVERITY: How bad is the cough today?      Getting  3. SPUTUM: Describe the color of your sputum (e.g., none, dry cough; clear, white, yellow, green)     *No Answer* 4. HEMOPTYSIS: Are you coughing up any blood? If Yes, ask: How much? (e.g., flecks, streaks, tablespoons, etc.)     No  5. DIFFICULTY BREATHING: Are you having difficulty breathing? If Yes, ask: How bad is it? (e.g., mild, moderate, severe)      No  6. FEVER: Do you have a fever? If Yes, ask: What is your temperature, how was it measured, and when did it start?     Unsure of fever  7. CARDIAC HISTORY: Do you have any history of heart disease? (e.g., heart attack, congestive heart failure)      *No Answer* 8. LUNG HISTORY: Do you have any history of lung disease?  (e.g., pulmonary embolus, asthma, emphysema)     *No Answer* 9. PE RISK FACTORS: Do you have a history of blood clots? (or: recent major surgery, recent prolonged travel, bedridden)     *No Answer* 10. OTHER SYMPTOMS: Do you have any other symptoms? (e.g., runny nose, wheezing,  chest pain)       *No Answer* 11. PREGNANCY: Is there any chance you are pregnant? When was your last menstrual period?       *No Answer* 12. TRAVEL: Have you traveled out of the country in the last month? (e.g., travel history, exposures)       *No Answer*  Protocols used: Cough - Acute Productive-A-AH

## 2023-10-21 ENCOUNTER — Encounter: Payer: Self-pay | Admitting: General Practice

## 2023-10-29 ENCOUNTER — Other Ambulatory Visit: Payer: Self-pay | Admitting: Urology

## 2023-10-29 DIAGNOSIS — E291 Testicular hypofunction: Secondary | ICD-10-CM

## 2023-10-29 MED ORDER — TESTOSTERONE CYPIONATE 200 MG/ML IM SOLN: INTRAMUSCULAR | 1 refills | Status: DC

## 2023-10-29 NOTE — Progress Notes (Deleted)
     Fawzi Melman T. Shalyn Koral, MD, CAQ Sports Medicine [facility] at [facility] [address] [place], [address]  Phone: [phone]  FAX: [phone]  [name] - 53 y.o. male  MRN [id number]  Date of Birth: [birth year]  Date: 10/30/2023  PCP: Vincente Shivers, NP  Referral: Vincente Shivers, NP  No chief complaint on file.  Subjective:   [name] is a 53 y.o. very pleasant male patient with There is no height or weight on file to calculate BMI. who presents with the following:  The patient presents with ongoing right-sided knee pain.  I previously saw him in June with some peroneal tendinopathy on the right side.  When I previously saw him, it seemed as if the lateral leg and peroneal tendon was the primary issue.  He has now been having knee pain for roughly 6 months.  He also has previously seen orthopedic surgery, and they felt as if his knee was fairly benign and recommended conservative care.    Review of Systems is noted in the HPI, as appropriate  Objective:   There were no vitals taken for this visit.  GEN: No acute distress; alert,appropriate. PULM: Breathing comfortably in no respiratory distress PSYCH: Normally interactive.   Laboratory and Imaging Data:  Assessment and Plan:   ***

## 2023-10-30 ENCOUNTER — Ambulatory Visit: Payer: MEDICAID | Admitting: Family Medicine

## 2023-10-30 DIAGNOSIS — G8929 Other chronic pain: Secondary | ICD-10-CM

## 2023-10-31 NOTE — Telephone Encounter (Signed)
 Called patient

## 2023-11-04 ENCOUNTER — Telehealth: Payer: Self-pay | Admitting: Urology

## 2023-11-04 ENCOUNTER — Other Ambulatory Visit: Payer: Self-pay | Admitting: Urology

## 2023-11-04 DIAGNOSIS — E291 Testicular hypofunction: Secondary | ICD-10-CM

## 2023-11-04 MED ORDER — TESTOSTERONE CYPIONATE 200 MG/ML IM SOLN: INTRAMUSCULAR | 1 refills | Status: DC

## 2023-11-04 NOTE — Telephone Encounter (Signed)
 [facility]  [address], [place], [place] [address] [facility]: [phone]   Patients regular pharmacy can not get script that was sent in for testosterone . Patients Pharmacy can not transfer prescription. If someone can please send it to the [facility] pharmacy

## 2023-11-05 ENCOUNTER — Encounter: Payer: Self-pay | Admitting: Dermatology

## 2023-11-05 ENCOUNTER — Ambulatory Visit (INDEPENDENT_AMBULATORY_CARE_PROVIDER_SITE_OTHER): Payer: MEDICAID | Admitting: Dermatology

## 2023-11-05 DIAGNOSIS — L649 Androgenic alopecia, unspecified: Secondary | ICD-10-CM | POA: Diagnosis not present

## 2023-11-05 DIAGNOSIS — L821 Other seborrheic keratosis: Secondary | ICD-10-CM | POA: Diagnosis not present

## 2023-11-05 MED ORDER — MINOXIDIL 2.5 MG PO TABS: 1.2500 mg | ORAL_TABLET | Freq: Every day | ORAL | 5 refills | Status: AC

## 2023-11-05 NOTE — Progress Notes (Signed)
   New Patient Visit   Subjective  [name] is a 53 y.o. male who presents for the following: check scalp, hair is falling out in clumps,started a few weeks ago. Patient started losing weight in March/April, about 35 pounds. Per patient, he has good nutrition. He has not used anything for hair loss. Started testosterone  3 months ago. Patient does use ketoconazole shampoo.  Patient would also like legs checked, itchy, uses AmLactin. Has used [facility] and HC in the past, prescribed by Dr. Dela.  The following portions of the chart were reviewed this encounter and updated as appropriate: medications, allergies, medical history  Review of Systems:  No other skin or systemic complaints except as noted in HPI or Assessment and Plan.  Objective  Well appearing patient in no apparent distress; mood and affect are within normal limits.    A focused examination was performed of the following areas: Scalp, legs  Relevant exam findings are noted in the Assessment and Plan.    Assessment & Plan   ANDROGENETIC ALOPECIA (MALE PATTERN HAIR LOSS), chronic and flaring, likely exacerbated by testosterone  supplements Exam: Frontal scalp thinning with intact frontal hairline and miniaturization    Androgenetic Alopecia (or Male pattern hair loss) refers to the common patterned hair loss affecting many men.  Male pattern alopecia is mediated by dihydrotestosterone which induces miniaturization of androgen-sensitive hair follicles.  It is chronic and persistent, but treatable; not curable. Topical treatment includes: - 5% topical Minoxidil  Oral treatment includes: - Minoxidil  1.25 - 5 mg qd   Treatment Plan: Patient advised testosterone  can speed up hair loss. Recommend patient discuss with prescribing provider.  Will check labs. Cbc ferritin vitamin D  Start minoxidil  1.25 mg every day.  Patient advised it can take up to 6 months to see improvement.   Doses of oral minoxidil  for hair loss  are considered 'low dose'. This is because the doses used for hair loss are much lower than the doses which are used for conditions such as high blood pressure (hypertension). The doses used for hypertension are 10-40mg  per day.  Side effects are uncommon at the low doses (up to 2.5 mg/day) used to treat hair loss. Potential side effects, more commonly seen at higher doses, include: Increase in hair growth (hypertrichosis) elsewhere on face and body Temporary hair shedding upon starting medication which may last up to 4 weeks Ankle swelling, fluid retention, rapid weight gain more than 5 pounds Low blood pressure and feeling lightheaded or dizzy when standing up quickly Fast or irregular heartbeat Headaches  Long term medication management.  Patient is using long term (months to years) prescription medication  to control their dermatologic condition.  These medications require periodic monitoring to evaluate for efficacy and side effects and may require periodic laboratory monitoring.         SEBORRHEIC KERATOSIS - Stuck-on, waxy, tan-brown papules and/or plaques  - Benign-appearing - Discussed benign etiology and prognosis. - Observe - Call for any changes   ANDROGENIC ALOPECIA   Related Procedures Vitamin D, 25-hydroxy Ferritin CBC with Differential/Platelets SEBORRHEIC KERATOSES    Return in about 6 months (around 05/07/2024) for Alopecia, with Dr. Claudene.  [name], RMA, am acting as scribe for Boneta Claudene, MD .   Documentation: I have reviewed the above documentation for accuracy and completeness, and I agree with the above.  Boneta Claudene, MD

## 2023-11-05 NOTE — Patient Instructions (Signed)

## 2023-11-05 NOTE — Progress Notes (Signed)
 [name] T. Verne Lanuza, MD, CAQ Sports Medicine [facility] [address] [place], [address]  Phone: [phone]  FAX: [phone]  [name] - 53 y.o. male  MRN [id number]  Date of Birth: [birth year]  Date: 11/06/2023  PCP: Vincente Shivers, NP  Referral: Vincente Shivers, NP  Chief Complaint  Patient presents with   Knee Pain   Ankle Pain    Left   Subjective:   [name] is a 53 y.o. very pleasant male patient with Body mass index is 26.32 kg/m. who presents with the following:  The patient presents with ongoing right-sided knee pain.  I previously saw him in June with some peroneal tendinopathy on the right side.  When I previously saw him, it seemed as if the lateral leg and peroneal tendon was the primary issue.  He has now been having knee pain for roughly 6 months.  He also has previously seen orthopedic surgery, and they felt as if his knee was fairly benign and recommended conservative care.  Discussed the use of AI scribe software for clinical note transcription with the patient, who gave verbal consent to proceed.  History of Present Illness [name] is a 53 year old male with a history of right ankle fracture who presents with right ankle pain.  He experiences right ankle pain.  The pain is primarily located at the back of the ankle, extending down to the heel, and is particularly troublesome when he begins to walk. He describes a sensation of something 'popping' and feeling like 'electricity' in the affected area. His gait is affected due to this issue.  The pain can shoot up and down from the knee to the ankle, more prominently on the outer side of the leg. No significant pain in the calf itself. He also reports some knee pain, specifically in the posterior aspect. He has not experienced any specific injury to the knee that he can recall.  He has a history of left-sided ankle pain following a lateral ankle fracture  that did not heal ideally.   He is concerned about the potential for the condition to worsen to the point where he might have difficulty walking.     Review of Systems is noted in the HPI, as appropriate  Objective:   BP 130/60   Pulse 93   Temp 97.8 F (36.6 C) (Temporal)   Ht 5' 9 (1.753 m)   Wt 178 lb 4 oz (80.9 kg)   SpO2 97%   BMI 26.32 kg/m   GEN: No acute distress; alert,appropriate. PULM: Breathing comfortably in no respiratory distress PSYCH: Normally interactive.    ANKLE: R Echymosis: no Edema: no ROM: Full dorsi and plantar flexion, inversion, eversion Gait: heel toe, non-antalgic Lateral Mall: NT Medial Mall: NT Talus: NT Navicular: NT Cuboid: NT Calcaneous: NT Metatarsals: NT 5th MT: NT Phalanges: NT Achilles: NT Plantar Fascia: NT Fat Pad: NT Peroneals: TTP Post Tib: NT Great Toe: Nml motion Ant Drawer: neg Talar Tilt: neg ATFL: NT CFL: NT Deltoid: NT Str: 5/5 Other Special tests: none Sensation: intact   Laboratory and Imaging Data:  Assessment and Plan:     ICD-10-CM   1. Chronic pain of right ankle  M25.571 DG Ankle Complete Right   G89.29     2. Peroneal tendinitis, right leg  M76.71       Assessment and Plan  Burst of steroids, home rehab Physical therapy consultation if symptoms persist  in 2 to 3 weeks  Suggested ASO ankle brace, and the patient declined Assessment & Plan Right peroneal tendinopathy and right ankle pain Chronic right ankle pain with previous left-sided ankle fracture complications, exacerbated by movement, with tenderness in peroneal tendons and lateral leg. Differential includes peroneal tendinopathy  - Order right ankle x-ray to assess current structural status. - Consider MRI if x-ray findings are inconclusive and symptoms persist.  Right posterior knee pain Intermittent posterior knee pain without swelling or effusion. No significant tenderness in patellofemoral or medial/lateral joints.  Ligaments intact, no ACL or meniscus injury. Pain may be related to the same muscle complex affecting the ankle. - Reassess if pain persists or worsens.     Medication Management during today's office visit: Meds ordered this encounter  Medications   predniSONE  (DELTASONE ) 20 MG tablet    Sig: 2 tabs po daily for 5 days, then 1 tab po daily for 5 days    Dispense:  15 tablet    Refill:  0   There are no discontinued medications.  Orders placed today for conditions managed today: Orders Placed This Encounter  Procedures   DG Ankle Complete Right    Disposition: Return in about 6 weeks (around 12/18/2023) for Dr. Watt, R ankle.  [employer] speech-to-text software was used for transcription in this dictation.  Possible transcriptional errors can occur using Animal nutritionist.   Signed,  Jacques DASEN. Eureka Valdes, MD   Outpatient Encounter Medications as of 11/06/2023  Medication Sig   albuterol  (VENTOLIN  HFA) 108 (90 Base) MCG/ACT inhaler Inhale 2 puffs into the lungs every 6 (six) hours as needed for wheezing or shortness of breath.   Amino Acids (AMINO ACID PO) Take by mouth.   Collagen-Vitamin C-Biotin (COLLAGEN PO) Take by mouth.   CREATINE PO Take by mouth.   fluticasone  (FLONASE ) 50 MCG/ACT nasal spray Place 2 sprays into both nostrils daily.   gabapentin  (NEURONTIN ) 100 MG capsule Take 1 capsule (100 mg total) by mouth 3 (three) times daily.   Insulin  Syringe-Needle U-100 27G X 1/2 0.5 ML MISC Use as directed   Magnesium 80 MG TABS Take by mouth.   Melatonin-Theanine (MELATONIN CALM SLEEP PO) Take by mouth.   meloxicam  (MOBIC ) 7.5 MG tablet Take 1 tablet (7.5 mg total) by mouth daily.   minoxidil  (LONITEN ) 2.5 MG tablet Take 0.5 tablets (1.25 mg total) by mouth daily.   predniSONE  (DELTASONE ) 20 MG tablet 2 tabs po daily for 5 days, then 1 tab po daily for 5 days   PROTEIN PO Take by mouth.   tadalafil  (CIALIS ) 20 MG tablet Take 1 tablet (20 mg total) by mouth daily  as needed.   testosterone  cypionate (DEPOTESTOSTERONE CYPIONATE) 200 MG/ML injection Inject 0.40 ml subcutaneously on Monday and Friday   valACYclovir  (VALTREX ) 500 MG tablet Take 1 tablet (500 mg total) by mouth 2 (two) times daily. For cold sore flare up.   Vit D-Ca Beta Hydrox Beta Meth (HMB PRO PO) Take by mouth.   VITAMIN D , CHOLECALCIFEROL, PO Take by mouth.   No facility-administered encounter medications on file as of 11/06/2023.

## 2023-11-06 ENCOUNTER — Encounter: Payer: Self-pay | Admitting: Family Medicine

## 2023-11-06 ENCOUNTER — Ambulatory Visit
Admission: RE | Admit: 2023-11-06 | Discharge: 2023-11-06 | Disposition: A | Discharge status: Home / Self Care | Payer: MEDICAID | Source: Ambulatory Visit | Attending: Family Medicine | Admitting: Family Medicine

## 2023-11-06 ENCOUNTER — Ambulatory Visit (INDEPENDENT_AMBULATORY_CARE_PROVIDER_SITE_OTHER): Payer: MEDICAID | Admitting: Family Medicine

## 2023-11-06 VITALS — BP 130/60 | HR 93 | Temp 97.8°F | Ht 69.0 in | Wt 178.2 lb

## 2023-11-06 DIAGNOSIS — M25571 Pain in right ankle and joints of right foot: Secondary | ICD-10-CM | POA: Diagnosis not present

## 2023-11-06 DIAGNOSIS — G8929 Other chronic pain: Secondary | ICD-10-CM

## 2023-11-06 DIAGNOSIS — M7671 Peroneal tendinitis, right leg: Secondary | ICD-10-CM

## 2023-11-06 MED ORDER — PREDNISONE 20 MG PO TABS: ORAL_TABLET | ORAL | 0 refills | Status: DC

## 2023-11-07 ENCOUNTER — Other Ambulatory Visit: Payer: Self-pay | Admitting: Dermatology

## 2023-11-07 ENCOUNTER — Ambulatory Visit: Payer: Self-pay | Admitting: Dermatology

## 2023-11-07 ENCOUNTER — Telehealth: Payer: Self-pay | Admitting: Dermatology

## 2023-11-07 DIAGNOSIS — L649 Androgenic alopecia, unspecified: Secondary | ICD-10-CM

## 2023-11-07 LAB — CBC WITH DIFFERENTIAL/PLATELET
Basophils Absolute: 0.1 x10E3/uL (ref 0.0–0.2)
Basos: 1 %
EOS (ABSOLUTE): 0.2 x10E3/uL (ref 0.0–0.4)
Eos: 2 %
Hematocrit: 46.4 % (ref 37.5–51.0)
Hemoglobin: 16 g/dL (ref 13.0–17.7)
Immature Grans (Abs): 0 x10E3/uL (ref 0.0–0.1)
Immature Granulocytes: 0 %
Lymphocytes Absolute: 1.3 x10E3/uL (ref 0.7–3.1)
Lymphs: 18 %
MCH: 33.5 pg — ABNORMAL HIGH (ref 26.6–33.0)
MCHC: 34.5 g/dL (ref 31.5–35.7)
MCV: 97 fL (ref 79–97)
Monocytes Absolute: 0.7 x10E3/uL (ref 0.1–0.9)
Monocytes: 9 %
Neutrophils Absolute: 5.2 x10E3/uL (ref 1.4–7.0)
Neutrophils: 70 %
Platelets: 218 x10E3/uL (ref 150–450)
RBC: 4.77 x10E6/uL (ref 4.14–5.80)
RDW: 13.8 % (ref 11.6–15.4)
WBC: 7.5 x10E3/uL (ref 3.4–10.8)

## 2023-11-07 LAB — FERRITIN: Ferritin: 72 ng/mL (ref 30–400)

## 2023-11-07 LAB — VITAMIN D 25 HYDROXY (VIT D DEFICIENCY, FRACTURES): Vit D, 25-Hydroxy: 59.8 ng/mL (ref 30.0–100.0)

## 2023-11-07 MED ORDER — FINASTERIDE 1 MG PO TABS: 1.0000 mg | ORAL_TABLET | Freq: Every day | ORAL | 1 refills | Status: AC

## 2023-11-07 NOTE — Telephone Encounter (Signed)
-----   Message ----- From: Roseann Adine PARAS., MD ([place]) Sent: 11/07/2023  12:52 PM EDT To: Boneta Sharps, MD [facility]) Subject: RE: Finasteride  for hair loss  That should be fine.    ----- Message ----- From: Sharps Boneta, MD [facility]) Sent: 11/07/2023  10:29 AM EDT To: Adine DOROTHA Roseann, MD [facility]) Subject: Finasteride  for hair loss  Hi Dr Roseann,  I hope you've been well. Your patient wants to try finasteride  for male pattern hair loss. Will it interfere with your testosterone  replacement therapy? Do you have any concerns with the patient taking it?  Thank you, Terrall Sharps, [facility]

## 2023-11-11 ENCOUNTER — Ambulatory Visit: Payer: MEDICAID | Admitting: General Practice

## 2023-11-15 ENCOUNTER — Ambulatory Visit: Payer: MEDICAID | Admitting: General Practice

## 2023-11-19 ENCOUNTER — Encounter: Payer: Self-pay | Admitting: General Practice

## 2023-11-19 ENCOUNTER — Ambulatory Visit: Payer: Self-pay | Admitting: General Practice

## 2023-11-19 ENCOUNTER — Ambulatory Visit (INDEPENDENT_AMBULATORY_CARE_PROVIDER_SITE_OTHER): Payer: MEDICAID | Admitting: General Practice

## 2023-11-19 VITALS — BP 122/68 | HR 80 | Temp 98.0°F | Ht 69.0 in | Wt 181.0 lb

## 2023-11-19 DIAGNOSIS — J302 Other seasonal allergic rhinitis: Secondary | ICD-10-CM

## 2023-11-19 DIAGNOSIS — L659 Nonscarring hair loss, unspecified: Secondary | ICD-10-CM

## 2023-11-19 DIAGNOSIS — R7303 Prediabetes: Secondary | ICD-10-CM | POA: Diagnosis not present

## 2023-11-19 DIAGNOSIS — M25511 Pain in right shoulder: Secondary | ICD-10-CM

## 2023-11-19 DIAGNOSIS — H5789 Other specified disorders of eye and adnexa: Secondary | ICD-10-CM

## 2023-11-19 DIAGNOSIS — Z1211 Encounter for screening for malignant neoplasm of colon: Secondary | ICD-10-CM

## 2023-11-19 DIAGNOSIS — M25561 Pain in right knee: Secondary | ICD-10-CM

## 2023-11-19 DIAGNOSIS — F17200 Nicotine dependence, unspecified, uncomplicated: Secondary | ICD-10-CM

## 2023-11-19 DIAGNOSIS — G8929 Other chronic pain: Secondary | ICD-10-CM

## 2023-11-19 LAB — HEMOGLOBIN A1C: Hgb A1c MFr Bld: 6 % (ref 4.6–6.5)

## 2023-11-19 LAB — TSH: TSH: 0.66 u[IU]/mL (ref 0.35–5.50)

## 2023-11-19 MED ORDER — FLUTICASONE PROPIONATE 50 MCG/ACT NA SUSP
2.0000 | Freq: Every day | NASAL | 2 refills | Status: AC
Start: 2023-11-19 — End: ?

## 2023-11-19 NOTE — Progress Notes (Signed)
 Established Patient Office Visit  Subjective   Patient ID: [name], male    DOB: 1970/04/09  Age: 53 y.o. MRN: [id number]  Chief Complaint  Patient presents with   chronic care management    Patient here today to follow up on chronic conditions. Patient states his ankle, shoulder and knee still hurting. Patient taking gabapentin  100 mg; takes 2 at night and is okay and states he can not take it during the day as it makes him sleepy. Patient feels like its going to get worse in the cold and wants to know if theres anything else that can be done?    HPI  Discussed the use of AI scribe software for clinical note transcription with the patient, who gave verbal consent to proceed.  History of Present Illness [name] is a 53 year old male who presents today for chronic care management.  He experiences ongoing pain in his shoulder and knee, describing it as 'annoying' and causing gait problems. The pain sometimes radiates from the back of his knee to his ankle, particularly affecting the top of his ankle. It occurs when he moves his leg in certain directions and when he is on his feet all day. He is following with ortho and reports that x-ray findings did not show anything.  He has been taking prednisone  for ten days, which he finds effective, but the pain returns as he nears the end of the course. He wants to avoid long-term use of prednisone . Additionally, he takes gabapentin  at night, which he finds helpful, especially for his shoulder pain.  He mentions hair loss, which began about a month ago, and attributes it to testosterone  use, rapid weight loss, and possibly stress. He has seen a dermatologist who diagnosed alopecia, and his recent blood work showed normal CBC, ferritin, and vitamin D  levels. His thyroid  function was normal in January, but he is open to rechecking it due to the recent onset of hair loss.    Patient Active Problem List   Diagnosis Date Noted   Recurrent  herpes labialis 07/31/2023   Right elbow pain 07/31/2023   Mild intermittent asthma without complication 05/14/2023   Need for shingles vaccine 05/14/2023   Acute pain of right knee 05/01/2023   Low testosterone  in male 05/01/2023   Colon cancer screening 05/01/2023   Smoker 04/03/2023   Chronic right shoulder pain 04/03/2023   Seasonal allergies 04/03/2023   Encounter for screening and preventative care 04/03/2023   Past Medical History:  Diagnosis Date   Abuse, drug or alcohol (HCC)    Anxiety    Asthma    Closed nondisplaced fracture of head of left radius 12/13/2021   Depression    Hx of opioid abuse (HCC) 04/03/2023   Past Surgical History:  Procedure Laterality Date   IR RADIOLOGIST EVAL & MGMT  03/16/2020   No Known Allergies       11/19/2023   12:22 PM 09/06/2023    2:50 PM 07/31/2023   11:12 AM  Depression screen PHQ 2/9  Decreased Interest 0 0 0  Down, Depressed, Hopeless 0 0 0  PHQ - 2 Score 0 0 0  Altered sleeping 0  0  Tired, decreased energy 0  0  Change in appetite 0  0  Feeling bad or failure about yourself  0  0  Trouble concentrating 0  0  Moving slowly or fidgety/restless 0  0  Suicidal thoughts 0  0  PHQ-9 Score 0  0  Difficult doing work/chores Not difficult at all Not difficult at all Not difficult at all       11/19/2023   12:22 PM 05/14/2023   10:59 AM 05/01/2023   11:32 AM 04/03/2023   10:48 AM  GAD 7 : Generalized Anxiety Score  Nervous, Anxious, on Edge 0 0 0 0  Control/stop worrying 0 0 0 0  Worry too much - different things 0 0 0 0  Trouble relaxing 0 0 0 0  Restless 0 0 0 2  Easily annoyed or irritable 0 0 0 2  Afraid - awful might happen 0 0 0 0  Total GAD 7 Score 0 0 0 4  Anxiety Difficulty Not difficult at all Not difficult at all Not difficult at all Somewhat difficult      Review of Systems  Constitutional:  Negative for chills and fever.  Respiratory:  Negative for shortness of breath.   Cardiovascular:  Negative for chest  pain.  Gastrointestinal:  Negative for abdominal pain, constipation, diarrhea, heartburn, nausea and vomiting.  Genitourinary:  Negative for dysuria, frequency and urgency.  Musculoskeletal:  Positive for joint pain.  Neurological:  Negative for dizziness and headaches.  Endo/Heme/Allergies:  Negative for polydipsia.  Psychiatric/Behavioral:  Negative for depression and suicidal ideas. The patient is not nervous/anxious.       Objective:     BP 122/68   Pulse 80   Temp 98 F (36.7 C) (Oral)   Ht 5' 9 (1.753 m)   Wt 181 lb (82.1 kg)   SpO2 96%   BMI 26.73 kg/m  BP Readings from Last 3 Encounters:  11/19/23 122/68  11/06/23 130/60  09/23/23 90/60   Wt Readings from Last 3 Encounters:  11/19/23 181 lb (82.1 kg)  11/06/23 178 lb 4 oz (80.9 kg)  09/23/23 188 lb 4 oz (85.4 kg)      Physical Exam Vitals and nursing note reviewed.  Constitutional:      Appearance: Normal appearance.  Cardiovascular:     Rate and Rhythm: Normal rate and regular rhythm.     Pulses: Normal pulses.     Heart sounds: Normal heart sounds.  Pulmonary:     Effort: Pulmonary effort is normal.     Breath sounds: Normal breath sounds.  Neurological:     Mental Status: He is alert and oriented to person, place, and time.  Psychiatric:        Mood and Affect: Mood normal.        Behavior: Behavior normal.        Thought Content: Thought content normal.        Judgment: Judgment normal.      No results found for any visits on 11/19/23.     The 10-year ASCVD risk score (Arnett DK, et al., 2019) is: 7%    Assessment & Plan:  Hair loss -     TSH  Seasonal allergies -     Fluticasone  Propionate; Place 2 sprays into both nostrils daily.  Dispense: 16 mL; Refill: 2  Prediabetes -     Hemoglobin A1c  Screening for colon cancer -     Cologuard  Acute pain of right knee  Chronic right shoulder pain  Smoker    Assessment and Plan Assessment & Plan Right knee and right shoulder  pain Chronic pain with intermittent exacerbations affecting gait and daily activities. Previous x-ray inconclusive. Pain management with prednisone  and gabapentin  effective. Concerns about potential loss of insurance coverage and desires  MRI for further evaluation. - Follow up with Dr. Ubaldo for further evaluation and potential MRI of the right knee and shoulder. - Continue gabapentin  for pain management.  Nonscarring hair loss Recent onset potentially related to testosterone  therapy, rapid weight loss, nutritional deficiencies, and stress.  - TSH and hemoglobin A1c pending. - Continue follow-up with dermatology for ongoing evaluation.  Prediabetes Previously diagnosed with prediabetes with an A1c of 6.1 in January. - Recheck diabetes status with lab work to monitor for any changes.  Seasonal allergic rhinitis Symptoms exacerbated by weather changes. Flonase  effective in managing symptoms. - Continue Flonase  for symptom management.   Return in about 4 months (around 04/03/2024) for physical and labs.[name] [name], NP

## 2023-11-19 NOTE — Patient Instructions (Addendum)
 Stop by the lab prior to leaving today. I will notify you of your results once received.   I sent the cologuard. Please follow instructions on the box.   Call and schedule your three month follow with urology.   It was a pleasure to see you today!

## 2023-11-21 ENCOUNTER — Ambulatory Visit: Payer: Self-pay | Admitting: Family Medicine

## 2023-11-26 ENCOUNTER — Encounter: Payer: Self-pay | Admitting: Acute Care

## 2023-12-03 ENCOUNTER — Other Ambulatory Visit: Payer: Self-pay | Admitting: General Practice

## 2023-12-03 DIAGNOSIS — J4520 Mild intermittent asthma, uncomplicated: Secondary | ICD-10-CM

## 2023-12-11 ENCOUNTER — Encounter: Payer: Self-pay | Admitting: *Deleted

## 2023-12-13 NOTE — Addendum Note (Signed)
 Addended by: VINCENTE SHIVERS on: 12/13/2023 08:55 AM   Modules accepted: Orders

## 2023-12-13 NOTE — Telephone Encounter (Signed)
 I was not aware that the original location his referral was sent [facility]) was not in Network with his insurance. When this happens we instruct the patients to view their insurance plan or contact their insurance company to find preferred locations as we do not have access to this information.    A new referral has been placed for this patient, stating that the Patient would like to be referred to [facility] in [facility] for eye irritation. There was no Provider name, address or phone number given for this location so I had to Google it.    There are two different locations in the [place] area - but the same providers seem to float between the locations as well as [place] area.  The patient will need to specify which location he wants to be seen at when he contacts them.   [facility] [address] [place], [place] [address] Phone: [phone]     [facility] [address] [place] [address], [place] [address] Phone: [phone]  I have faxed this referral to [facility], they will review and contact the patient to schedule or the patient can contact the directly to schedule.   I have discussed this message with my supervisor Josefine Duncans) and the CMA (Jessica Isley) for [name], NP.   Harlene is going to contact the patient with the referral information and instructions. Given the circumstance and the tone of the most recent patient message(s) sent directly to me, I do not feel comfortable reaching out to the patient directly.

## 2023-12-13 NOTE — Addendum Note (Signed)
 Addended by: TENNIE RAISIN B on: 12/13/2023 08:49 AM   Modules accepted: Orders

## 2023-12-18 ENCOUNTER — Ambulatory Visit: Payer: MEDICAID | Admitting: Family Medicine

## 2023-12-30 ENCOUNTER — Other Ambulatory Visit: Payer: Self-pay | Admitting: Family Medicine

## 2023-12-30 NOTE — Telephone Encounter (Signed)
 Refill request for gabapentin  (NEURONTIN ) 100 MG capsule   LOV - 11/19/23 Next OV - 04/06/24 Last refill - 09/23/23 #90/3

## 2024-01-04 NOTE — Progress Notes (Unsigned)
     Hadlea Furuya T. Carloyn Lahue, MD, CAQ Sports Medicine [facility] [address] [place], [address]  Phone: [phone]  FAX: [phone]  SLAYTER MOORHOUSE - 53 y.o. male  MRN [id number]  Date of Birth: 1970-08-05  Date: 01/06/2024  PCP: Vincente Shivers, NP  Referral: Vincente Shivers, NP  No chief complaint on file.  Subjective:   MOSI HANNOLD is a 53 y.o. very pleasant male patient with There is no height or weight on file to calculate BMI. who presents with the following:  Discussed the use of AI scribe software for clinical note transcription with the patient, who gave verbal consent to proceed.  Patient presents with ongoing shoulder pain.  I saw him previously for right sided shoulder pain and glenohumeral arthritis.  Radiographs are significant for severe glenohumeral arthritis, very advanced for age. History of Present Illness     Review of Systems is noted in the HPI, as appropriate  Objective:   There were no vitals taken for this visit.  GEN: No acute distress; alert,appropriate. PULM: Breathing comfortably in no respiratory distress PSYCH: Normally interactive.   Laboratory and Imaging Data:  Assessment and Plan:   No diagnosis found. Assessment & Plan   Medication Management during today's office visit: No orders of the defined types were placed in this encounter.  There are no discontinued medications.  Orders placed today for conditions managed today: No orders of the defined types were placed in this encounter.   Disposition: No follow-ups on file.  Dr[employer] One speech-to-text software was used for transcription in this dictation.  Possible transcriptional errors can occur using Animal nutritionist.   Signed,  Jacques DASEN. Umi Mainor, MD   Outpatient Encounter Medications as of 01/06/2024  Medication Sig   Amino Acids (AMINO ACID PO) Take by mouth.   Collagen-Vitamin C-Biotin (COLLAGEN PO) Take by mouth.    CREATINE PO Take by mouth.   finasteride  (PROPECIA ) 1 MG tablet Take 1 tablet (1 mg total) by mouth daily.   fluticasone  (FLONASE ) 50 MCG/ACT nasal spray Place 2 sprays into both nostrils daily.   gabapentin  (NEURONTIN ) 100 MG capsule TAKE ONE CAPSULE (100 MG TOTAL) BY MOUTH THREE TIMES DAILY.   Insulin  Syringe-Needle U-100 27G X 1/2 0.5 ML MISC Use as directed   Magnesium 80 MG TABS Take by mouth.   Melatonin-Theanine (MELATONIN CALM SLEEP PO) Take by mouth.   meloxicam  (MOBIC ) 7.5 MG tablet Take 1 tablet (7.5 mg total) by mouth daily.   minoxidil  (LONITEN ) 2.5 MG tablet Take 0.5 tablets (1.25 mg total) by mouth daily.   predniSONE  (DELTASONE ) 20 MG tablet 2 tabs po daily for 5 days, then 1 tab po daily for 5 days   PROTEIN PO Take by mouth.   tadalafil  (CIALIS ) 20 MG tablet Take 1 tablet (20 mg total) by mouth daily as needed.   testosterone  cypionate (DEPOTESTOSTERONE CYPIONATE) 200 MG/ML injection Inject 0.40 ml subcutaneously on Monday and Friday   valACYclovir  (VALTREX ) 500 MG tablet Take 1 tablet (500 mg total) by mouth 2 (two) times daily. For cold sore flare up.   VENTOLIN  HFA 108 (90 Base) MCG/ACT inhaler INHALE TWO PUFFS INTO THE LUNGS EVERY SIX (SIX) HOURS AS NEEDED FOR WHEEZING OR SHORTNESS OF BREATH.   Vit D-Ca Beta Hydrox Beta Meth (HMB PRO PO) Take by mouth.   VITAMIN D , CHOLECALCIFEROL, PO Take by mouth.   No facility-administered encounter medications on file as of 01/06/2024.

## 2024-01-06 ENCOUNTER — Ambulatory Visit (INDEPENDENT_AMBULATORY_CARE_PROVIDER_SITE_OTHER): Payer: MEDICAID | Admitting: Family Medicine

## 2024-01-06 ENCOUNTER — Encounter: Payer: Self-pay | Admitting: Family Medicine

## 2024-01-06 VITALS — BP 120/60 | HR 72 | Temp 98.7°F | Ht 69.0 in | Wt 184.0 lb

## 2024-01-06 DIAGNOSIS — M25561 Pain in right knee: Secondary | ICD-10-CM | POA: Diagnosis not present

## 2024-01-06 DIAGNOSIS — M19011 Primary osteoarthritis, right shoulder: Secondary | ICD-10-CM

## 2024-01-06 MED ORDER — TRIAMCINOLONE ACETONIDE 40 MG/ML IJ SUSP
40.0000 mg | Freq: Once | INTRAMUSCULAR | Status: AC
  Administered 2024-01-06: 40 mg via INTRA_ARTICULAR

## 2024-01-06 MED ORDER — MELOXICAM 15 MG PO TABS: 15.0000 mg | ORAL_TABLET | Freq: Every day | ORAL | 3 refills | Status: DC

## 2024-01-06 MED ORDER — GABAPENTIN 100 MG PO CAPS: 200.0000 mg | ORAL_CAPSULE | Freq: Three times a day (TID) | ORAL | 3 refills | Status: DC

## 2024-01-06 NOTE — Addendum Note (Signed)
 Addended by: WENDELL ARLAND RAMAN on: 01/06/2024 11:33 AM   Modules accepted: Orders

## 2024-01-13 ENCOUNTER — Encounter: Payer: Self-pay | Admitting: *Deleted

## 2024-01-13 ENCOUNTER — Encounter: Payer: Self-pay | Admitting: General Practice

## 2024-01-13 ENCOUNTER — Telehealth: Payer: Self-pay

## 2024-01-13 ENCOUNTER — Encounter: Payer: Self-pay | Admitting: Family Medicine

## 2024-01-13 DIAGNOSIS — J4520 Mild intermittent asthma, uncomplicated: Secondary | ICD-10-CM

## 2024-01-13 NOTE — Telephone Encounter (Signed)
 [name],   Can you call him and apologize for the delay.  There may be some confusion, since I referred him to Dr. Dozier at [facility].  He is the only pure shoulder surgeon in our region, and I would strongly recommend that he see him.  E2C2 Team, please follow-up with the patient about his referral to Dr. Dozier, and help him get this consult.

## 2024-01-13 NOTE — Telephone Encounter (Signed)
 I have refaxed the referral to [facility] two times.  Hopefully they get it this time.   Here is the fax comfirmation   [id number] Delivery Method: Clinical Attachment Action: Fax Generated Fax Number: [phone] Referrals Attachment: Landy Melody M (1211 kB)

## 2024-01-13 NOTE — Telephone Encounter (Signed)
 Dr Watt, Please advise, Pt stated in another Mychart message that he cannot be seen by [facility] and needs a different Ortho Provider.   [name] to [facility] (supporting Jacques Watt, MD)  01/13/24 10:08 AM Hi dr Ubaldo, can you please refer me to another surgeon, I'm not able to be seen at [facility] at the moment due to an outstanding balance, if you could please refer me to another surgeon that would be great, is there one at [facility]?  Who do you recommend for the patients specific diagnosis outside of [facility]?  Please advise, thanks.

## 2024-01-13 NOTE — Telephone Encounter (Signed)
 Per patient's MyChart message today:  Can you please refer me to another surgeon, I'm not able to be seen at [facility] at the moment due to an outstanding balance, if you could please refer me to another surgeon that would be great, is there one at [facility]?

## 2024-01-13 NOTE — Telephone Encounter (Signed)
 See MyChart message 01/13/24 I have faxed this again electronically. Office is closed today, unable to follow up on this referral.   Sending to CMA to follow up and discuss further with this patient.

## 2024-01-13 NOTE — Telephone Encounter (Signed)
Left message for [name] to return call to office

## 2024-01-13 NOTE — Telephone Encounter (Signed)
 Copied from CRM [id number]. Topic: Referral - Request for Referral >> Jan 10, 2024  2:40 PM Burnard DEL wrote: Did the patient discuss referral with their provider in the last year? Yes (If No - schedule appointment) (If Yes - send message)  Appointment offered? No  Type of order/referral and detailed reason for visit: [facility]  Preference of office, provider, location:[address] [place] Fx#:[phone]  If referral order, have you been seen by this specialty before? No (If Yes, this issue or another issue? When? Where?  Can we respond through MyChart? Yes  **Patient called this location ans stated that he was suppose to be seen there,he said Dr Watt referred him there,however they have no referral for him on file**

## 2024-01-14 ENCOUNTER — Encounter: Payer: Self-pay | Admitting: Family Medicine

## 2024-01-14 NOTE — Telephone Encounter (Signed)
 Copied from CRM #[id number]. Topic: Referral - Status >> Jan 14, 2024  9:34 AM Pinkey ORN wrote: Reason for CRM: [facility] >> Jan 14, 2024  9:35 AM Pinkey ORN wrote: [facility]  Called on behalf of patient, states he owes a balance that he's not willing to pay therefor they're unable to see him.

## 2024-01-15 NOTE — Telephone Encounter (Signed)
 Please assist the patient to get an appointment with Dr. Edie at [facility].

## 2024-01-15 NOTE — Telephone Encounter (Signed)
 Referral has been rerouted to Dr. Bonner Hair at [facility]   Patient have been made aware via Mychart message and Mychart letter.   See other Mychart message (newer message)

## 2024-01-15 NOTE — Telephone Encounter (Signed)
 Dr Watt I have already faxed everything to [facility] and contacted the patient letting him know that we were sending all of his information [facility].   I was not aware that his referral location has been changed again.  Are we now going with Dr Edie at [facility] or trying [facility] first.   There seems to be a lot of overlap in messages and it is causing a ton of confusion... this is the 3rd message open on this patient for this same Ortho referral.

## 2024-01-15 NOTE — Telephone Encounter (Signed)
 See referral notes.   This referral has been updated and faxed to [facility].

## 2024-01-15 NOTE — Telephone Encounter (Signed)
 Referral has been faxed to [facility] per Dr Tia request.   Patient made aware via Mychart

## 2024-01-15 NOTE — Telephone Encounter (Addendum)
 I called to follow up on this referral with [facility]  They still have not received the referral. I verified the fax number with the office. They stated that they do not handle their own referrals anymore, they have a team that handles those now and they are not sure of the turn around time for scheduling. The office stated that they are not even sure if they have received our faxes but if we faxed it to the number they provided us  [phone]) which we have, then it will have been routed to their Queue. They do not have any contact information for the referral team, but there is an alternate fax number we can use.   Their Referral Team direct fax, which may get a quicker turn around time, is [phone].  I will fax this referral directly to them   I have reached out and explained this to the patient and asked if he wants to continue with this process or try to find another location that will accept his insurance.

## 2024-01-22 NOTE — Telephone Encounter (Signed)
 The patient asks to be referred to [name] and cancel his referral to Dr. Cristy at [facility].  At [facility], we should get him to see Dr. Edie, who does a lot of complex shoulder surgery and arthroplasty.  I know there have been multiple messages back and forth, but please refer the patient to Dr. Edie at [facility].

## 2024-01-23 NOTE — Telephone Encounter (Signed)
 This has been addressed within the referral and other mychart messages.   Nothing further needed.

## 2024-01-23 NOTE — Telephone Encounter (Signed)
 I will process the referral send to [facility] - Dr Edie  I will notify the patient

## 2024-01-27 ENCOUNTER — Encounter: Payer: Self-pay | Admitting: Radiology

## 2024-01-31 MED ORDER — ALBUTEROL SULFATE HFA 108 (90 BASE) MCG/ACT IN AERS: 2.0000 | INHALATION_SPRAY | Freq: Four times a day (QID) | RESPIRATORY_TRACT | 0 refills | Status: DC | PRN

## 2024-01-31 NOTE — Addendum Note (Signed)
 Addended by: VINCENTE SHIVERS on: 01/31/2024 02:49 PM   Modules accepted: Orders

## 2024-02-07 ENCOUNTER — Encounter: Payer: Self-pay | Admitting: Urology

## 2024-02-10 ENCOUNTER — Other Ambulatory Visit: Payer: Self-pay | Admitting: Urology

## 2024-02-10 DIAGNOSIS — E291 Testicular hypofunction: Secondary | ICD-10-CM

## 2024-02-21 ENCOUNTER — Encounter: Payer: Self-pay | Admitting: General Practice

## 2024-02-21 DIAGNOSIS — B001 Herpesviral vesicular dermatitis: Secondary | ICD-10-CM

## 2024-02-24 MED ORDER — VALACYCLOVIR HCL 500 MG PO TABS: 500.0000 mg | ORAL_TABLET | Freq: Two times a day (BID) | ORAL | 1 refills | Status: AC

## 2024-02-24 MED ORDER — VALACYCLOVIR HCL 500 MG PO TABS: 500.0000 mg | ORAL_TABLET | Freq: Two times a day (BID) | ORAL | 1 refills | Status: DC

## 2024-02-24 NOTE — Addendum Note (Signed)
 Addended by: TENNIE RAISIN B on: 02/24/2024 08:54 AM   Modules accepted: Orders

## 2024-03-05 ENCOUNTER — Telehealth: Payer: MEDICAID | Admitting: Physician Assistant

## 2024-03-05 DIAGNOSIS — B9689 Other specified bacterial agents as the cause of diseases classified elsewhere: Secondary | ICD-10-CM

## 2024-03-05 DIAGNOSIS — J019 Acute sinusitis, unspecified: Secondary | ICD-10-CM | POA: Diagnosis not present

## 2024-03-05 MED ORDER — BENZONATATE 100 MG PO CAPS: 100.0000 mg | ORAL_CAPSULE | Freq: Three times a day (TID) | ORAL | 0 refills | Status: AC | PRN

## 2024-03-05 MED ORDER — AMOXICILLIN-POT CLAVULANATE 875-125 MG PO TABS: 1.0000 | ORAL_TABLET | Freq: Two times a day (BID) | ORAL | 0 refills | Status: DC

## 2024-03-05 NOTE — Progress Notes (Signed)
 Virtual Visit Consent   [name], you are scheduled for a virtual visit with a [facility] provider today. Just as with appointments in the office, your consent must be obtained to participate. Your consent will be active for this visit and any virtual visit you may have with one of our providers in the next 365 days. If you have a MyChart account, a copy of this consent can be sent to you electronically.  As this is a virtual visit, video technology does not allow for your provider to perform a traditional examination. This may limit your provider's ability to fully assess your condition. If your provider identifies any concerns that need to be evaluated in person or the need to arrange testing (such as labs, EKG, etc.), we will make arrangements to do so. Although advances in technology are sophisticated, we cannot ensure that it will always work on either your end or our end. If the connection with a video visit is poor, the visit may have to be switched to a telephone visit. With either a video or telephone visit, we are not always able to ensure that we have a secure connection.  By engaging in this virtual visit, you consent to the provision of healthcare and authorize for your insurance to be billed (if applicable) for the services provided during this visit. Depending on your insurance coverage, you may receive a charge related to this service.  I need to obtain your verbal consent now. Are you willing to proceed with your visit today? [name] has provided verbal consent on 03/05/2024 for a virtual visit (video or telephone). [name], [place]  Date: 03/05/2024 6:55 PM   Virtual Visit via Video Note   I, [name], connected with  [name]  ([id number], [birth year]) on 03/05/2024 at  6:45 PM EST by a video-enabled telemedicine application and verified that I am speaking with the correct person using two identifiers.  Location: Patient: Virtual Visit  Location Patient: Home Provider: Virtual Visit Location Provider: Home Office   I discussed the limitations of evaluation and management by telemedicine and the availability of in person appointments. The patient expressed understanding and agreed to proceed.    History of Present Illness: [name] is a 53 y.o. who identifies as a male who was assigned male at birth, and is being seen today for several days of worsening sore throat, fatigue, nasal congestion, sinus pressure. Today with onset of a dry cough, but persistent.  Denies fever, chills. Sinus pressure and ear ringing, with sinus pain. Nasal discharge has gone from clear to thick and dark yellow. Has history of sinus infections.  OTC -- Mucinex.    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  Resting comfortably at home.  Head is normocephalic, atraumatic.  No labored breathing. Speech is clear and coherent with logical content.  Patient is alert and oriented at baseline.  Assessment and Plan: 1. Acute bacterial sinusitis (Primary) - benzonatate (TESSALON) 100 MG capsule; Take 1 capsule (100 mg total) by mouth 3 (three) times daily as needed for cough.  Dispense: 30 capsule; Refill: 0 - amoxicillin -clavulanate (AUGMENTIN) 875-125 MG tablet; Take 1 tablet by mouth 2 (two) times daily.  Dispense: 14 tablet;  Refill: 0  Rx Augmentin.  Increase fluids.  Rest.  Saline nasal spray.  Probiotic.  Mucinex as directed.  Humidifier in bedroom. Resume Flonase . Tessalon per orders.  Call or return to clinic if symptoms are not improving.   Follow Up Instructions: I discussed the assessment and treatment plan with the patient. The patient was provided an opportunity to ask questions and all were answered. The patient agreed with the plan and demonstrated an understanding of the instructions.  A copy of instructions were sent to the patient via MyChart unless otherwise noted below.   The patient was advised to call back or seek an in-person evaluation if the symptoms worsen or if the condition fails to improve as anticipated.    Gregory Velma Lunger, PA-C    [1] No Known Allergies [2]  Current Outpatient Medications:    amoxicillin -clavulanate (AUGMENTIN) 875-125 MG tablet, Take 1 tablet by mouth 2 (two) times daily., Disp: 14 tablet, Rfl: 0   benzonatate (TESSALON) 100 MG capsule, Take 1 capsule (100 mg total) by mouth 3 (three) times daily as needed for cough., Disp: 30 capsule, Rfl: 0   albuterol  (VENTOLIN  HFA) 108 (90 Base) MCG/ACT inhaler, Inhale 2 puffs into the lungs every 6 (six) hours as needed for wheezing or shortness of breath., Disp: 18 each, Rfl: 0   Amino Acids (AMINO ACID PO), Take by mouth., Disp: , Rfl:    Collagen-Vitamin C-Biotin (COLLAGEN PO), Take by mouth., Disp: , Rfl:    CREATINE PO, Take by mouth., Disp: , Rfl:    finasteride  (PROPECIA ) 1 MG tablet, Take 1 tablet (1 mg total) by mouth daily., Disp: 90 tablet, Rfl: 1   fluticasone  (FLONASE ) 50 MCG/ACT nasal spray, Place 2 sprays into both nostrils daily., Disp: 16 mL, Rfl: 2   gabapentin  (NEURONTIN ) 100 MG capsule, Take 2 capsules (200 mg total) by mouth 3 (three) times daily., Disp: 180 capsule, Rfl: 3   Insulin  Syringe-Needle U-100 27G X 1/2 0.5 ML MISC, Use as directed, Disp: 10 each, Rfl: 5   Magnesium 80 MG TABS, Take by  mouth., Disp: , Rfl:    Melatonin-Theanine (MELATONIN CALM SLEEP PO), Take by mouth., Disp: , Rfl:    meloxicam  (MOBIC ) 15 MG tablet, Take 1 tablet (15 mg total) by mouth daily., Disp: 30 tablet, Rfl: 3   minoxidil  (LONITEN ) 2.5 MG tablet, Take 0.5 tablets (1.25 mg total) by mouth daily., Disp: 15 tablet, Rfl: 5   PROTEIN PO, Take by mouth., Disp: , Rfl:    tadalafil  (CIALIS ) 20 MG tablet, Take 1 tablet (20 mg total) by mouth daily as needed., Disp: 10 tablet, Rfl: 11   testosterone  cypionate (DEPOTESTOSTERONE CYPIONATE) 200 MG/ML injection, Inject 0.40 ml subcutaneously on Monday and Friday, Disp: 10 mL, Rfl: 1   valACYclovir  (VALTREX ) 500 MG tablet, Take 1 tablet (500 mg total) by mouth 2 (two) times daily. For cold sore flare up., Disp: 14 tablet, Rfl: 1   Vit D-Ca Beta Hydrox Beta Meth (HMB PRO PO), Take by mouth., Disp: , Rfl:    VITAMIN D , CHOLECALCIFEROL, PO, Take by mouth., Disp: , Rfl:

## 2024-03-05 NOTE — Patient Instructions (Signed)
 [name], thank you for joining Gregory Velma Lunger, PA-C for today's virtual visit.  While this provider is not your primary care provider (PCP), if your PCP is located in our provider database this encounter information will be shared with them immediately following your visit.   A [facility] MyChart account gives you access to today's visit and all your visits, tests, and labs performed at [facility]  click here if you don't have a [facility] MyChart account or go to [email or web address]  Consent: (Patient) [name] provided verbal consent for this virtual visit at the beginning of the encounter.  Current Medications:  Current Outpatient Medications:    albuterol  (VENTOLIN  HFA) 108 (90 Base) MCG/ACT inhaler, Inhale 2 puffs into the lungs every 6 (six) hours as needed for wheezing or shortness of breath., Disp: 18 each, Rfl: 0   Amino Acids (AMINO ACID PO), Take by mouth., Disp: , Rfl:    Collagen-Vitamin C-Biotin (COLLAGEN PO), Take by mouth., Disp: , Rfl:    CREATINE PO, Take by mouth., Disp: , Rfl:    finasteride  (PROPECIA ) 1 MG tablet, Take 1 tablet (1 mg total) by mouth daily., Disp: 90 tablet, Rfl: 1   fluticasone  (FLONASE ) 50 MCG/ACT nasal spray, Place 2 sprays into both nostrils daily., Disp: 16 mL, Rfl: 2   gabapentin  (NEURONTIN ) 100 MG capsule, Take 2 capsules (200 mg total) by mouth 3 (three) times daily., Disp: 180 capsule, Rfl: 3   Insulin  Syringe-Needle U-100 27G X 1/2 0.5 ML MISC, Use as directed, Disp: 10 each, Rfl: 5   Magnesium 80 MG TABS, Take by mouth., Disp: , Rfl:    Melatonin-Theanine (MELATONIN CALM SLEEP PO), Take by mouth., Disp: , Rfl:    meloxicam  (MOBIC ) 15 MG tablet, Take 1 tablet (15 mg total) by mouth daily., Disp: 30 tablet, Rfl: 3   minoxidil  (LONITEN ) 2.5 MG tablet, Take 0.5 tablets (1.25 mg total) by mouth daily., Disp: 15 tablet, Rfl: 5   predniSONE  (DELTASONE ) 20 MG tablet, 2 tabs po daily for 5 days, then 1 tab po  daily for 5 days, Disp: 15 tablet, Rfl: 0   PROTEIN PO, Take by mouth., Disp: , Rfl:    tadalafil  (CIALIS ) 20 MG tablet, Take 1 tablet (20 mg total) by mouth daily as needed., Disp: 10 tablet, Rfl: 11   testosterone  cypionate (DEPOTESTOSTERONE CYPIONATE) 200 MG/ML injection, Inject 0.40 ml subcutaneously on Monday and Friday, Disp: 10 mL, Rfl: 1   valACYclovir  (VALTREX ) 500 MG tablet, Take 1 tablet (500 mg total) by mouth 2 (two) times daily. For cold sore flare up., Disp: 14 tablet, Rfl: 1   Vit D-Ca Beta Hydrox Beta Meth (HMB PRO PO), Take by mouth., Disp: , Rfl:    VITAMIN D , CHOLECALCIFEROL, PO, Take by mouth., Disp: , Rfl:    Medications ordered in this encounter:  No orders of the defined types were placed in this encounter.    *If you need refills on other medications prior to your next appointment, please contact your pharmacy*  Follow-Up: Call back or seek an in-person evaluation if the symptoms worsen or if the condition fails to improve as anticipated.  [facility] [phone]  Other Instructions Please take antibiotic as directed.  Increase fluid intake.  Use Saline nasal spray.  Take a daily multivitamin. Restart your Flonase .  Place a humidifier in the bedroom.  Please call or return clinic if symptoms are not improving.  Sinusitis Sinusitis is redness, soreness, and swelling (  inflammation) of the paranasal sinuses. Paranasal sinuses are air pockets within the bones of your face (beneath the eyes, the middle of the forehead, or above the eyes). In healthy paranasal sinuses, mucus is able to drain out, and air is able to circulate through them by way of your nose. However, when your paranasal sinuses are inflamed, mucus and air can become trapped. This can allow bacteria and other germs to grow and cause infection. Sinusitis can develop quickly and last only a short time (acute) or continue over a long period (chronic). Sinusitis that lasts for more than 12 weeks  is considered chronic.  CAUSES  Causes of sinusitis include: Allergies. Structural abnormalities, such as displacement of the cartilage that separates your nostrils (deviated septum), which can decrease the air flow through your nose and sinuses and affect sinus drainage. Functional abnormalities, such as when the small hairs (cilia) that line your sinuses and help remove mucus do not work properly or are not present. SYMPTOMS  Symptoms of acute and chronic sinusitis are the same. The primary symptoms are pain and pressure around the affected sinuses. Other symptoms include: Upper toothache. Earache. Headache. Bad breath. Decreased sense of smell and taste. A cough, which worsens when you are lying flat. Fatigue. Fever. Thick drainage from your nose, which often is green and may contain pus (purulent). Swelling and warmth over the affected sinuses. DIAGNOSIS  Your caregiver will perform a physical exam. During the exam, your caregiver may: Look in your nose for signs of abnormal growths in your nostrils (nasal polyps). Tap over the affected sinus to check for signs of infection. View the inside of your sinuses (endoscopy) with a special imaging device with a light attached (endoscope), which is inserted into your sinuses. If your caregiver suspects that you have chronic sinusitis, one or more of the following tests may be recommended: Allergy tests. Nasal culture A sample of mucus is taken from your nose and sent to a lab and screened for bacteria. Nasal cytology A sample of mucus is taken from your nose and examined by your caregiver to determine if your sinusitis is related to an allergy. TREATMENT  Most cases of acute sinusitis are related to a viral infection and will resolve on their own within 10 days. Sometimes medicines are prescribed to help relieve symptoms (pain medicine, decongestants, nasal steroid sprays, or saline sprays).  However, for sinusitis related to a bacterial  infection, your caregiver will prescribe antibiotic medicines. These are medicines that will help kill the bacteria causing the infection.  Rarely, sinusitis is caused by a fungal infection. In theses cases, your caregiver will prescribe antifungal medicine. For some cases of chronic sinusitis, surgery is needed. Generally, these are cases in which sinusitis recurs more than 3 times per year, despite other treatments. HOME CARE INSTRUCTIONS  Drink plenty of water. Water helps thin the mucus so your sinuses can drain more easily. Use a humidifier. Inhale steam 3 to 4 times a day (for example, sit in the bathroom with the shower running). Apply a warm, moist washcloth to your face 3 to 4 times a day, or as directed by your caregiver. Use saline nasal sprays to help moisten and clean your sinuses. Take over-the-counter or prescription medicines for pain, discomfort, or fever only as directed by your caregiver. SEEK IMMEDIATE MEDICAL CARE IF: You have increasing pain or severe headaches. You have nausea, vomiting, or drowsiness. You have swelling around your face. You have vision problems. You have a stiff neck. You  have difficulty breathing. MAKE SURE YOU:  Understand these instructions. Will watch your condition. Will get help right away if you are not doing well or get worse. Document Released: 03/12/2005 Document Revised: 06/04/2011 Document Reviewed: 03/27/2011 [facility] Patient Information 2014 [place], [place].    If you have been instructed to have an in-person evaluation today at a local Urgent Care facility, please use the link below. It will take you to a list of all of our available [facility], including address, phone number and hours of operation. Please do not delay care.  [facility]  If you or a family member do not have a primary care provider, use the link below to schedule a visit and establish care. When you choose a [facility] primary care  physician or advanced practice provider, you gain a long-term partner in health. Find a Primary Care Provider  Learn more about [facility] in-office and virtual care options: [facility] - Get Care Now

## 2024-03-06 ENCOUNTER — Telehealth: Payer: MEDICAID | Admitting: Physician Assistant

## 2024-03-06 DIAGNOSIS — R051 Acute cough: Secondary | ICD-10-CM

## 2024-03-06 MED ORDER — PSEUDOEPH-BROMPHEN-DM 30-2-10 MG/5ML PO SYRP: 5.0000 mL | ORAL_SOLUTION | Freq: Four times a day (QID) | ORAL | 0 refills | Status: DC | PRN

## 2024-03-06 MED ORDER — PREDNISONE 20 MG PO TABS: 40.0000 mg | ORAL_TABLET | Freq: Every day | ORAL | 0 refills | Status: DC

## 2024-03-06 NOTE — Patient Instructions (Signed)
 [name], thank you for joining [name], PA-C for today's virtual visit.  While this provider is not your primary care provider (PCP), if your PCP is located in our provider database this encounter information will be shared with them immediately following your visit.   A [facility] MyChart account gives you access to today's visit and all your visits, tests, and labs performed at [facility]  click here if you don't have a [facility] MyChart account or go to [email or web address]  Consent: (Patient) [name] provided verbal consent for this virtual visit at the beginning of the encounter.  Current Medications:  Current Outpatient Medications:    brompheniramine-pseudoephedrine-DM 30-2-10 MG/5ML syrup, Take 5 mLs by mouth 4 (four) times daily as needed., Disp: 120 mL, Rfl: 0   predniSONE  (DELTASONE ) 20 MG tablet, Take 2 tablets (40 mg total) by mouth daily with breakfast., Disp: 10 tablet, Rfl: 0   albuterol  (VENTOLIN  HFA) 108 (90 Base) MCG/ACT inhaler, Inhale 2 puffs into the lungs every 6 (six) hours as needed for wheezing or shortness of breath., Disp: 18 each, Rfl: 0   Amino Acids (AMINO ACID PO), Take by mouth., Disp: , Rfl:    amoxicillin -clavulanate (AUGMENTIN) 875-125 MG tablet, Take 1 tablet by mouth 2 (two) times daily., Disp: 14 tablet, Rfl: 0   benzonatate (TESSALON) 100 MG capsule, Take 1 capsule (100 mg total) by mouth 3 (three) times daily as needed for cough., Disp: 30 capsule, Rfl: 0   Collagen-Vitamin C-Biotin (COLLAGEN PO), Take by mouth., Disp: , Rfl:    CREATINE PO, Take by mouth., Disp: , Rfl:    finasteride  (PROPECIA ) 1 MG tablet, Take 1 tablet (1 mg total) by mouth daily., Disp: 90 tablet, Rfl: 1   fluticasone  (FLONASE ) 50 MCG/ACT nasal spray, Place 2 sprays into both nostrils daily., Disp: 16 mL, Rfl: 2   gabapentin  (NEURONTIN ) 100 MG capsule, Take 2 capsules (200 mg total) by mouth 3 (three) times daily., Disp: 180 capsule,  Rfl: 3   Insulin  Syringe-Needle U-100 27G X 1/2 0.5 ML MISC, Use as directed, Disp: 10 each, Rfl: 5   Magnesium 80 MG TABS, Take by mouth., Disp: , Rfl:    Melatonin-Theanine (MELATONIN CALM SLEEP PO), Take by mouth., Disp: , Rfl:    meloxicam  (MOBIC ) 15 MG tablet, Take 1 tablet (15 mg total) by mouth daily., Disp: 30 tablet, Rfl: 3   minoxidil  (LONITEN ) 2.5 MG tablet, Take 0.5 tablets (1.25 mg total) by mouth daily., Disp: 15 tablet, Rfl: 5   PROTEIN PO, Take by mouth., Disp: , Rfl:    tadalafil  (CIALIS ) 20 MG tablet, Take 1 tablet (20 mg total) by mouth daily as needed., Disp: 10 tablet, Rfl: 11   testosterone  cypionate (DEPOTESTOSTERONE CYPIONATE) 200 MG/ML injection, Inject 0.40 ml subcutaneously on Monday and Friday, Disp: 10 mL, Rfl: 1   valACYclovir  (VALTREX ) 500 MG tablet, Take 1 tablet (500 mg total) by mouth 2 (two) times daily. For cold sore flare up., Disp: 14 tablet, Rfl: 1   Vit D-Ca Beta Hydrox Beta Meth (HMB PRO PO), Take by mouth., Disp: , Rfl:    VITAMIN D , CHOLECALCIFEROL, PO, Take by mouth., Disp: , Rfl:    Medications ordered in this encounter:  Meds ordered this encounter  Medications   predniSONE  (DELTASONE ) 20 MG tablet    Sig: Take 2 tablets (40 mg total) by mouth daily with breakfast.    Dispense:  10 tablet    Refill:  0  Supervising Provider:   BLAISE ALEENE KIDD [[id number]]   brompheniramine-pseudoephedrine-DM 30-2-10 MG/5ML syrup    Sig: Take 5 mLs by mouth 4 (four) times daily as needed.    Dispense:  120 mL    Refill:  0    Supervising Provider:   BLAISE ALEENE KIDD [[id number]]     *If you need refills on other medications prior to your next appointment, please contact your pharmacy*  Follow-Up: Call back or seek an in-person evaluation if the symptoms worsen or if the condition fails to improve as anticipated.  [facility] [phone]  Other Instructions Acute Bronchitis, Adult  Acute bronchitis is sudden inflammation of the main  airways (bronchi) that come off the windpipe (trachea) in the lungs. The swelling causes the airways to get smaller and make more mucus than normal. This can make it hard to breathe and can cause coughing or noisy breathing (wheezing). Acute bronchitis may last several weeks. The cough may last longer. Allergies, asthma, and exposure to smoke may make the condition worse. What are the causes? This condition can be caused by germs and by substances that irritate the lungs, including: Cold and flu viruses. The most common cause of this condition is the virus that causes the common cold. Bacteria. This is less common. Breathing in substances that irritate the lungs, including: Smoke from cigarettes and other forms of tobacco. Dust and pollen. Fumes from household cleaning products, gases, or burned fuel. Indoor or outdoor air pollution. What increases the risk? The following factors may make you more likely to develop this condition: A weak body's defense system, also called the immune system. A condition that affects your lungs and breathing, such as asthma. What are the signs or symptoms? Common symptoms of this condition include: Coughing. This may bring up clear, yellow, or green mucus from your lungs (sputum). Wheezing. Runny or stuffy nose. Having too much mucus in your lungs (chest congestion). Shortness of breath. Aches and pains, including sore throat or chest. How is this diagnosed? This condition is usually diagnosed based on: Your symptoms and medical history. A physical exam. You may also have other tests, including tests to rule out other conditions, such as pneumonia. These tests include: A test of lung function. Test of a mucus sample to look for the presence of bacteria. Tests to check the oxygen level in your blood. Blood tests. Chest X-ray. How is this treated? Most cases of acute bronchitis clear up over time without treatment. Your health care provider may  recommend: Drinking more fluids to help thin your mucus so it is easier to cough up. Taking inhaled medicine (inhaler) to improve air flow in and out of your lungs. Using a vaporizer or a humidifier. These are machines that add water to the air to help you breathe better. Taking a medicine that thins mucus and clears congestion (expectorant). Taking a medicine that prevents or stops coughing (cough suppressant). It is not common to take an antibiotic medicine for this condition. Follow these instructions at home:  Take over-the-counter and prescription medicines only as told by your health care provider. Use an inhaler, vaporizer, or humidifier as told by your health care provider. Take two teaspoons (10 mL) of honey at bedtime to lessen coughing at night. Drink enough fluid to keep your urine pale yellow. Do not use any products that contain nicotine or tobacco. These products include cigarettes, chewing tobacco, and vaping devices, such as e-cigarettes. If you need help quitting, ask your health  care provider. Get plenty of rest. Return to your normal activities as told by your health care provider. Ask your health care provider what activities are safe for you. Keep all follow-up visits. This is important. How is this prevented? To lower your risk of getting this condition again: Wash your hands often with soap and water for at least 20 seconds. If soap and water are not available, use hand sanitizer. Avoid contact with people who have cold symptoms. Try not to touch your mouth, nose, or eyes with your hands. Avoid breathing in smoke or chemical fumes. Breathing smoke or chemical fumes will make your condition worse. Get the flu shot every year. Contact a health care provider if: Your symptoms do not improve after 2 weeks. You have trouble coughing up the mucus. Your cough keeps you awake at night. You have a fever. Get help right away if you: Cough up blood. Feel pain in your  chest. Have severe shortness of breath. Faint or keep feeling like you are going to faint. Have a severe headache. Have a fever or chills that get worse. These symptoms may represent a serious problem that is an emergency. Do not wait to see if the symptoms will go away. Get medical help right away. Call your local emergency services (911 in the [place] Do not drive yourself to the hospital. Summary Acute bronchitis is inflammation of the main airways (bronchi) that come off the windpipe (trachea) in the lungs. The swelling causes the airways to get smaller and make more mucus than normal. Drinking more fluids can help thin your mucus so it is easier to cough up. Take over-the-counter and prescription medicines only as told by your health care provider. Do not use any products that contain nicotine or tobacco. These products include cigarettes, chewing tobacco, and vaping devices, such as e-cigarettes. If you need help quitting, ask your health care provider. Contact a health care provider if your symptoms do not improve after 2 weeks. This information is not intended to replace advice given to you by your health care provider. Make sure you discuss any questions you have with your health care provider. Document Revised: 06/22/2021 Document Reviewed: 07/13/2020 Elsevier Patient Education  2024 Elsevier Inc.   If you have been instructed to have an in-person evaluation today at a local Urgent Care facility, please use the link below. It will take you to a list of all of our available [facility], including address, phone number and hours of operation. Please do not delay care.  [facility]  If you or a family member do not have a primary care provider, use the link below to schedule a visit and establish care. When you choose a [facility] primary care physician or advanced practice provider, you gain a long-term partner in health. Find a Primary Care Provider  Learn  more about [facility]'s in-office and virtual care options: [facility] - Get Care Now

## 2024-03-06 NOTE — Progress Notes (Signed)
 Virtual Visit Consent   [name], you are scheduled for a virtual visit with a [facility] provider today. Just as with appointments in the office, your consent must be obtained to participate. Your consent will be active for this visit and any virtual visit you may have with one of our providers in the next 365 days. If you have a MyChart account, a copy of this consent can be sent to you electronically.  As this is a virtual visit, video technology does not allow for your provider to perform a traditional examination. This may limit your provider's ability to fully assess your condition. If your provider identifies any concerns that need to be evaluated in person or the need to arrange testing (such as labs, EKG, etc.), we will make arrangements to do so. Although advances in technology are sophisticated, we cannot ensure that it will always work on either your end or our end. If the connection with a video visit is poor, the visit may have to be switched to a telephone visit. With either a video or telephone visit, we are not always able to ensure that we have a secure connection.  By engaging in this virtual visit, you consent to the provision of healthcare and authorize for your insurance to be billed (if applicable) for the services provided during this visit. Depending on your insurance coverage, you may receive a charge related to this service.  I need to obtain your verbal consent now. Are you willing to proceed with your visit today? [name] has provided verbal consent on 03/06/2024 for a virtual visit (video or telephone). Gregory Gregory Dickinson, PA-C  Date: 03/06/2024 4:49 PM   Virtual Visit via Video Note   I, [name], connected with  [name]  ([id number], 12/07/1970) on 03/06/2024 at  4:45 PM EST by a video-enabled telemedicine application and verified that I am speaking with the correct person using two identifiers.  Location: Patient: Virtual Visit  Location Patient: Home Provider: Virtual Visit Location Provider: Home Office   I discussed the limitations of evaluation and management by telemedicine and the availability of in person appointments. The patient expressed understanding and agreed to proceed.    History of Present Illness: [name] is a 53 y.o. who identifies as a male who was assigned male at birth, and is being seen today for cough. Seen yesterday virtually and prescribed Augmentin and Tessalon perles for possible sinus infection. He has started the medication and taken 1 dose of Augmentin, but reports his cough has worsened. He does have a history of bronchitis and feels this may be occurring now as well. He is requesting Prednisone  and a different cough medication for his cough and congestion.   Problems:  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   Right elbow pain 07/31/2023   Mild intermittent asthma without complication 05/14/2023   Need for shingles vaccine 05/14/2023   Acute pain of right knee 05/01/2023   Low testosterone  in male 05/01/2023   Colon cancer screening 05/01/2023   Smoker 04/03/2023   Chronic right shoulder pain 04/03/2023   Seasonal allergies 04/03/2023   Encounter for screening and preventative care 04/03/2023    Allergies: Allergies[1] Medications: Current Medications[2]  Observations/Objective: Patient is well-developed, well-nourished in no acute distress.  Resting comfortably at home.  Head is normocephalic, atraumatic.  No labored breathing.  Speech is clear and coherent with logical content.  Patient is  alert and oriented at baseline.    Assessment and Plan: 1. Acute cough (Primary) - predniSONE  (DELTASONE ) 20 MG tablet; Take 2 tablets (40 mg total) by mouth daily with breakfast.  Dispense: 10 tablet; Refill: 0 - brompheniramine-pseudoephedrine-DM 30-2-10 MG/5ML syrup; Take 5 mLs by mouth 4 (four) times daily as needed.   Dispense: 120 mL; Refill: 0  - Acute cough possible bronchitis worsening - Continue previously prescribed Augmentin and Tessalon perles - Add Bromfed DM and Prednisone  - Can add Mucinex (PLAIN) during the daytime - Push fluids.  - Rest.  - Steam and humidifier can help - Seek in person evaluation if worsening or symptoms fail to improve    Follow Up Instructions: I discussed the assessment and treatment plan with the patient. The patient was provided an opportunity to ask questions and all were answered. The patient agreed with the plan and demonstrated an understanding of the instructions.  A copy of instructions were sent to the patient via MyChart unless otherwise noted below.    The patient was advised to call back or seek an in-person evaluation if the symptoms worsen or if the condition fails to improve as anticipated.    Gregory HERO Kyden Potash, PA-C     [1] No Known Allergies [2]  Current Outpatient Medications:    brompheniramine-pseudoephedrine-DM 30-2-10 MG/5ML syrup, Take 5 mLs by mouth 4 (four) times daily as needed., Disp: 120 mL, Rfl: 0   predniSONE  (DELTASONE ) 20 MG tablet, Take 2 tablets (40 mg total) by mouth daily with breakfast., Disp: 10 tablet, Rfl: 0   albuterol  (VENTOLIN  HFA) 108 (90 Base) MCG/ACT inhaler, Inhale 2 puffs into the lungs every 6 (six) hours as needed for wheezing or shortness of breath., Disp: 18 each, Rfl: 0   Amino Acids (AMINO ACID PO), Take by mouth., Disp: , Rfl:    amoxicillin -clavulanate (AUGMENTIN) 875-125 MG tablet, Take 1 tablet by mouth 2 (two) times daily., Disp: 14 tablet, Rfl: 0   benzonatate (TESSALON) 100 MG capsule, Take 1 capsule (100 mg total) by mouth 3 (three) times daily as needed for cough., Disp: 30 capsule, Rfl: 0   Collagen-Vitamin C-Biotin (COLLAGEN PO), Take by mouth., Disp: , Rfl:    CREATINE PO, Take by mouth., Disp: , Rfl:    finasteride  (PROPECIA ) 1 MG tablet, Take 1 tablet (1 mg total) by mouth daily., Disp: 90  tablet, Rfl: 1   fluticasone  (FLONASE ) 50 MCG/ACT nasal spray, Place 2 sprays into both nostrils daily., Disp: 16 mL, Rfl: 2   gabapentin  (NEURONTIN ) 100 MG capsule, Take 2 capsules (200 mg total) by mouth 3 (three) times daily., Disp: 180 capsule, Rfl: 3   Insulin  Syringe-Needle U-100 27G X 1/2 0.5 ML MISC, Use as directed, Disp: 10 each, Rfl: 5   Magnesium 80 MG TABS, Take by mouth., Disp: , Rfl:    Melatonin-Theanine (MELATONIN CALM SLEEP PO), Take by mouth., Disp: , Rfl:    meloxicam  (MOBIC ) 15 MG tablet, Take 1 tablet (15 mg total) by mouth daily., Disp: 30 tablet, Rfl: 3   minoxidil  (LONITEN ) 2.5 MG tablet, Take 0.5 tablets (1.25 mg total) by mouth daily., Disp: 15 tablet, Rfl: 5   PROTEIN PO, Take by mouth., Disp: , Rfl:    tadalafil  (CIALIS ) 20 MG tablet, Take 1 tablet (20 mg total) by mouth daily as needed., Disp: 10 tablet, Rfl: 11   testosterone  cypionate (DEPOTESTOSTERONE CYPIONATE) 200 MG/ML injection, Inject 0.40 ml subcutaneously on Monday and Friday, Disp: 10 mL, Rfl: 1   valACYclovir  (  VALTREX ) 500 MG tablet, Take 1 tablet (500 mg total) by mouth 2 (two) times daily. For cold sore flare up., Disp: 14 tablet, Rfl: 1   Vit D-Ca Beta Hydrox Beta Meth (HMB PRO PO), Take by mouth., Disp: , Rfl:    VITAMIN D , CHOLECALCIFEROL, PO, Take by mouth., Disp: , Rfl:

## 2024-03-11 ENCOUNTER — Other Ambulatory Visit: Payer: Self-pay | Admitting: Urology

## 2024-03-11 DIAGNOSIS — E291 Testicular hypofunction: Secondary | ICD-10-CM

## 2024-03-12 ENCOUNTER — Ambulatory Visit: Payer: MEDICAID | Admitting: General Practice

## 2024-03-23 ENCOUNTER — Other Ambulatory Visit: Payer: Self-pay | Admitting: General Practice

## 2024-03-23 DIAGNOSIS — J4520 Mild intermittent asthma, uncomplicated: Secondary | ICD-10-CM

## 2024-03-25 ENCOUNTER — Ambulatory Visit: Payer: MEDICAID | Admitting: Urology

## 2024-03-25 ENCOUNTER — Telehealth: Payer: MEDICAID | Admitting: Physician Assistant

## 2024-03-25 ENCOUNTER — Encounter: Payer: Self-pay | Admitting: Urology

## 2024-03-25 VITALS — BP 131/71 | HR 94 | Ht 69.0 in | Wt 205.0 lb

## 2024-03-25 DIAGNOSIS — R058 Other specified cough: Secondary | ICD-10-CM

## 2024-03-25 DIAGNOSIS — N529 Male erectile dysfunction, unspecified: Secondary | ICD-10-CM

## 2024-03-25 DIAGNOSIS — E291 Testicular hypofunction: Secondary | ICD-10-CM | POA: Diagnosis not present

## 2024-03-25 LAB — CBC
Hematocrit: 46.3 % (ref 37.5–51.0)
Hemoglobin: 15.5 g/dL (ref 13.0–17.7)
MCH: 33.3 pg — ABNORMAL HIGH (ref 26.6–33.0)
MCHC: 33.5 g/dL (ref 31.5–35.7)
MCV: 100 fL — ABNORMAL HIGH (ref 79–97)
Platelets: 208 x10E3/uL (ref 150–450)
RBC: 4.65 x10E6/uL (ref 4.14–5.80)
RDW: 12.8 % (ref 11.6–15.4)
WBC: 6.4 x10E3/uL (ref 3.4–10.8)

## 2024-03-25 LAB — PSA: Prostate Specific Ag, Serum: 1.5 ng/mL (ref 0.0–4.0)

## 2024-03-25 LAB — TESTOSTERONE: Testosterone: 529 ng/dL (ref 264–916)

## 2024-03-25 MED ORDER — ALBUTEROL SULFATE (2.5 MG/3ML) 0.083% IN NEBU: 2.5000 mg | INHALATION_SOLUTION | Freq: Four times a day (QID) | RESPIRATORY_TRACT | 1 refills | Status: AC | PRN

## 2024-03-25 MED ORDER — PROMETHAZINE-DM 6.25-15 MG/5ML PO SYRP: 5.0000 mL | ORAL_SOLUTION | Freq: Four times a day (QID) | ORAL | 0 refills | Status: DC | PRN

## 2024-03-25 MED ORDER — TADALAFIL 20 MG PO TABS: 20.0000 mg | ORAL_TABLET | Freq: Every day | ORAL | 11 refills | Status: AC | PRN

## 2024-03-25 MED ORDER — PREDNISONE 10 MG (21) PO TBPK: ORAL_TABLET | ORAL | 0 refills | Status: AC

## 2024-03-25 NOTE — Progress Notes (Signed)
 "  Assessment: 1. Hypogonadism in male   2. Erectile dysfunction, unspecified erectile dysfunction type    Plan: Continue testosterone  cypionate 0.4 ml [place] on Monday and Friday. Labs today:  CBC, testosterone , PSA Continue tadalafil  20 mg as needed Return to office in 6 months  Chief Complaint:  Chief Complaint  Patient presents with   Hypogonadism    History of Present Illness:  [name] is a 53 y.o. male who is seen for further evaluation of hypogonadism. He has had symptoms of fatigue, decreased energy, decreased libido x 18 month.  He has not experienced problems with ED. ADAM score = 9/10  (no loss of height)  Labs from 1/25: Testosterone  136.16 PSA  0.81  He was not interested in maintaining his fertility.  No LUTS.  No dysuria or gross hematuria. Labs from 3/25: Testosterone  133 LH  26.7 Prolactin 10 Estradiol  15.8  He was started on subcutaneous micro-dosing of testosterone  in March 2025. Testosterone  from 4/25 increased to 322. His dose of subcutaneous testosterone  was increased to 0.4 mL on Monday and Friday. Testosterone  level from 6/25: 806.  He returns today for follow-up.  He continues to use testosterone  subcutaneously 0.4 mL on Mondays and Fridays.  He has noted significant improvement in his hypogonadal symptoms.  No problem with the injection technique.  No new urinary symptoms.  No dysuria or gross hematuria.   Portions of the above documentation were copied from a prior visit for review purposes only.   Past Medical History:  Past Medical History:  Diagnosis Date   Abuse, drug or alcohol (HCC)    Anxiety    Asthma    Closed nondisplaced fracture of head of left radius 12/13/2021   Depression    Hx of opioid abuse (HCC) 04/03/2023    Past Surgical History:  Past Surgical History:  Procedure Laterality Date   IR RADIOLOGIST EVAL & MGMT  03/16/2020    Allergies:  No Known Allergies  Family History:  Family History  Problem  Relation Age of Onset   Cancer Father        Lung   Prostate cancer Neg Hx    Bladder Cancer Neg Hx    Kidney cancer Neg Hx     Social History:  Social History   Tobacco Use   Smoking status: Every Day    Current packs/day: 2.00    Average packs/day: 2.0 packs/day for 33.7 years (67.3 ttl pk-yrs)    Types: Cigarettes    Start date: 07/25/1990   Smokeless tobacco: Never  Vaping Use   Vaping status: Never Used  Substance Use Topics   Alcohol use: Not Currently   Drug use: Not Currently    Types: Methamphetamines, Cocaine, Other-see comments    Comment: opiods.    ROS: Constitutional:  Negative for fever, chills, weight loss CV: Negative for chest pain, previous MI, hypertension Respiratory:  Negative for shortness of breath, wheezing, sleep apnea, frequent cough GI:  Negative for nausea, vomiting, bloody stool, GERD  Physical exam: BP 131/71   Pulse 94   Ht 5' 9 (1.753 m)   Wt 205 lb (93 kg)   BMI 30.27 kg/m  GENERAL APPEARANCE:  Well appearing, well developed, well nourished, NAD HEENT:  Atraumatic, normocephalic, oropharynx clear NECK:  Supple without lymphadenopathy or thyromegaly ABDOMEN:  Soft, non-tender, no masses EXTREMITIES:  Moves all extremities well, without clubbing, cyanosis, or edema NEUROLOGIC:  Alert and oriented x 3, normal gait, CN II-XII grossly intact MENTAL STATUS:  appropriate BACK:  Non-tender to palpation, No CVAT SKIN:  Warm, dry, and intact   Results: None "

## 2024-03-25 NOTE — Progress Notes (Signed)
 " Virtual Visit Consent   [name], you are scheduled for a virtual visit with a  provider today. Just as with appointments in the office, your consent must be obtained to participate. Your consent will be active for this visit and any virtual visit you may have with one of our providers in the next 365 days. If you have a MyChart account, a copy of this consent can be sent to you electronically.  As this is a virtual visit, video technology does not allow for your provider to perform a traditional examination. This may limit your provider's ability to fully assess your condition. If your provider identifies any concerns that need to be evaluated in person or the need to arrange testing (such as labs, EKG, etc.), we will make arrangements to do so. Although advances in technology are sophisticated, we cannot ensure that it will always work on either your end or our end. If the connection with a video visit is poor, the visit may have to be switched to a telephone visit. With either a video or telephone visit, we are not always able to ensure that we have a secure connection.  By engaging in this virtual visit, you consent to the provision of healthcare and authorize for your insurance to be billed (if applicable) for the services provided during this visit. Depending on your insurance coverage, you may receive a charge related to this service.  I need to obtain your verbal consent now. Are you willing to proceed with your visit today? [name] has provided verbal consent on 03/25/2024 for a virtual visit (video or telephone). [name], [place]  Date: 03/25/2024 2:02 PM   Virtual Visit via Video Note   I, [name], connected with  [name]  ([id number], 09-Feb-1971) on 03/25/2024 at  1:45 PM EST by a video-enabled telemedicine application and verified that I am speaking with the correct person using two identifiers.  Location: Patient: Virtual Visit  Location Patient: Home Provider: Virtual Visit Location Provider: Home Office   I discussed the limitations of evaluation and management by telemedicine and the availability of in person appointments. The patient expressed understanding and agreed to proceed.    History of Present Illness: [name] is a 53 y.o. who identifies as a male who was assigned male at birth, and is being seen today for continued intermittent chest congestion with chest tightness and dry cough.  Was treated for bacterial sinusitis a couple weeks ago with course of Augmentin  and a steroid.  Notes sinus symptoms have resolved but has been left with this residual cough that is not responding to OTC cough medications.  Again chest tightness and wheezing are during the night only.  Denies any known environmental triggers.  Denies fevers, sinus pain, chest pain.  He is using Flonase  over-the-counter as well as over-the-counter Robitussin.[name]  HPI: HPI  Problems:  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   Right elbow pain 07/31/2023   Mild intermittent asthma without complication 05/14/2023   Need for shingles vaccine 05/14/2023   Acute pain of right knee 05/01/2023   Low testosterone  in male 05/01/2023   Colon cancer screening 05/01/2023   Smoker 04/03/2023   Chronic right shoulder pain 04/03/2023   Seasonal allergies 04/03/2023   Encounter for screening and preventative care 04/03/2023    Allergies: Allergies[1] Medications: Current Medications[2]  Observations/Objective: Patient is well-developed, well-nourished in no acute distress.  Resting comfortably  at home.  Head is normocephalic, atraumatic.  No labored breathing.  Speech is clear and coherent with logical content.  Patient is alert and oriented at baseline.   Assessment and Plan: 1. Upper airway cough syndrome (Primary)  Post URI.  Supportive measures and OTC medications reviewed.  Continue  albuterol  nebulizer as needed.  Will add on Promethazine DM cough syrup and Sterapred pack.  Strict in person evaluation precautions reviewed.  Follow Up Instructions: I discussed the assessment and treatment plan with the patient. The patient was provided an opportunity to ask questions and all were answered. The patient agreed with the plan and demonstrated an understanding of the instructions.  A copy of instructions were sent to the patient via MyChart unless otherwise noted below.   The patient was advised to call back or seek an in-person evaluation if the symptoms worsen or if the condition fails to improve as anticipated.    [name], PA-C    [1] No Known Allergies [2]  Current Outpatient Medications:    albuterol  (VENTOLIN  HFA) 108 (90 Base) MCG/ACT inhaler, INHALE 2 PUFFS INTO THE LUNGS EVERY 6 HOURS AS NEEDED FOR WHEEZING OR SHORTNESS OF BREATH, Disp: 18 g, Rfl: 1   Amino Acids (AMINO ACID PO), Take by mouth., Disp: , Rfl:    benzonatate  (TESSALON ) 100 MG capsule, Take 1 capsule (100 mg total) by mouth 3 (three) times daily as needed for cough., Disp: 30 capsule, Rfl: 0   brompheniramine-pseudoephedrine-DM 30-2-10 MG/5ML syrup, Take 5 mLs by mouth 4 (four) times daily as needed., Disp: 120 mL, Rfl: 0   Collagen-Vitamin C-Biotin (COLLAGEN PO), Take by mouth., Disp: , Rfl:    CREATINE PO, Take by mouth., Disp: , Rfl:    finasteride  (PROPECIA ) 1 MG tablet, Take 1 tablet (1 mg total) by mouth daily., Disp: 90 tablet, Rfl: 1   fluticasone  (FLONASE ) 50 MCG/ACT nasal spray, Place 2 sprays into both nostrils daily., Disp: 16 mL, Rfl: 2   gabapentin  (NEURONTIN ) 100 MG capsule, Take 2 capsules (200 mg total) by mouth 3 (three) times daily., Disp: 180 capsule, Rfl: 3   Insulin  Syringe-Needle U-100 27G X 1/2 0.5 ML MISC, Use as directed, Disp: 10 each, Rfl: 5   Magnesium 80 MG TABS, Take by mouth., Disp: , Rfl:    Melatonin-Theanine (MELATONIN CALM SLEEP PO), Take by mouth., Disp:  , Rfl:    minoxidil  (LONITEN ) 2.5 MG tablet, Take 0.5 tablets (1.25 mg total) by mouth daily., Disp: 15 tablet, Rfl: 5   PROTEIN PO, Take by mouth., Disp: , Rfl:    tadalafil  (CIALIS ) 20 MG tablet, Take 1 tablet (20 mg total) by mouth daily as needed., Disp: 10 tablet, Rfl: 11   testosterone  cypionate (DEPOTESTOSTERONE CYPIONATE) 200 MG/ML injection, INJECT 0.40MLS SUBCUTANEOUSLY ON MONDAY AND FRIDAYS, Disp: 10 mL, Rfl: 0   valACYclovir  (VALTREX ) 500 MG tablet, Take 1 tablet (500 mg total) by mouth 2 (two) times daily. For cold sore flare up., Disp: 14 tablet, Rfl: 1   Vit D-Ca Beta Hydrox Beta Meth (HMB PRO PO), Take by mouth., Disp: , Rfl:    VITAMIN D , CHOLECALCIFEROL, PO, Take by mouth., Disp: , Rfl:   "

## 2024-03-25 NOTE — Patient Instructions (Signed)
 " [name], thank you for joining Gregory Velma Lunger, PA-C for today's virtual visit.  While this provider is not your primary care provider (PCP), if your PCP is located in our provider database this encounter information will be shared with them immediately following your visit.   A [name] account gives you access to today's visit and all your visits, tests, and labs performed at [facility]  click here if you don't have a [facility] account or go to [email or web address]  Consent: (Patient) [name] provided verbal consent for this virtual visit at the beginning of the encounter.  Current Medications:  Current Outpatient Medications:    albuterol  (PROVENTIL ) (2.5 MG/3ML) 0.083% nebulizer solution, Take 3 mLs (2.5 mg total) by nebulization every 6 (six) hours as needed for wheezing or shortness of breath., Disp: 150 mL, Rfl: 1   albuterol  (VENTOLIN  HFA) 108 (90 Base) MCG/ACT inhaler, INHALE 2 PUFFS INTO THE LUNGS EVERY 6 HOURS AS NEEDED FOR WHEEZING OR SHORTNESS OF BREATH, Disp: 18 g, Rfl: 1   Amino Acids (AMINO ACID PO), Take by mouth., Disp: , Rfl:    benzonatate  (TESSALON ) 100 MG capsule, Take 1 capsule (100 mg total) by mouth 3 (three) times daily as needed for cough., Disp: 30 capsule, Rfl: 0   Collagen-Vitamin C-Biotin (COLLAGEN PO), Take by mouth., Disp: , Rfl:    CREATINE PO, Take by mouth., Disp: , Rfl:    finasteride  (PROPECIA ) 1 MG tablet, Take 1 tablet (1 mg total) by mouth daily., Disp: 90 tablet, Rfl: 1   fluticasone  (FLONASE ) 50 MCG/ACT nasal spray, Place 2 sprays into both nostrils daily., Disp: 16 mL, Rfl: 2   gabapentin  (NEURONTIN ) 100 MG capsule, Take 2 capsules (200 mg total) by mouth 3 (three) times daily., Disp: 180 capsule, Rfl: 3   Insulin  Syringe-Needle U-100 27G X 1/2 0.5 ML MISC, Use as directed, Disp: 10 each, Rfl: 5   Magnesium 80 MG TABS, Take by mouth., Disp: , Rfl:    Melatonin-Theanine (MELATONIN CALM SLEEP  PO), Take by mouth., Disp: , Rfl:    minoxidil  (LONITEN ) 2.5 MG tablet, Take 0.5 tablets (1.25 mg total) by mouth daily., Disp: 15 tablet, Rfl: 5   predniSONE  (STERAPRED UNI-PAK 21 TAB) 10 MG (21) TBPK tablet, Take following package directions, Disp: 21 tablet, Rfl: 0   promethazine-dextromethorphan (PROMETHAZINE-DM) 6.25-15 MG/5ML syrup, Take 5 mLs by mouth 4 (four) times daily as needed for cough., Disp: 118 mL, Rfl: 0   PROTEIN PO, Take by mouth., Disp: , Rfl:    tadalafil  (CIALIS ) 20 MG tablet, Take 1 tablet (20 mg total) by mouth daily as needed., Disp: 10 tablet, Rfl: 11   testosterone  cypionate (DEPOTESTOSTERONE CYPIONATE) 200 MG/ML injection, INJECT 0.40MLS SUBCUTANEOUSLY ON MONDAY AND FRIDAYS, Disp: 10 mL, Rfl: 0   valACYclovir  (VALTREX ) 500 MG tablet, Take 1 tablet (500 mg total) by mouth 2 (two) times daily. For cold sore flare up., Disp: 14 tablet, Rfl: 1   Vit D-Ca Beta Hydrox Beta Meth (HMB PRO PO), Take by mouth., Disp: , Rfl:    VITAMIN D , CHOLECALCIFEROL, PO, Take by mouth., Disp: , Rfl:    Medications ordered in this encounter:  Meds ordered this encounter  Medications   albuterol  (PROVENTIL ) (2.5 MG/3ML) 0.083% nebulizer solution    Sig: Take 3 mLs (2.5 mg total) by nebulization every 6 (six) hours as needed for wheezing or shortness of breath.    Dispense:  150 mL    Refill:  1    Supervising Provider:   BLAISE ALEENE KIDD [[id number]]   predniSONE  (STERAPRED UNI-PAK 21 TAB) 10 MG (21) TBPK tablet    Sig: Take following package directions    Dispense:  21 tablet    Refill:  0    Supervising Provider:   LAMPTEY, PHILIP O [[id number]]   promethazine-dextromethorphan (PROMETHAZINE-DM) 6.25-15 MG/5ML syrup    Sig: Take 5 mLs by mouth 4 (four) times daily as needed for cough.    Dispense:  118 mL    Refill:  0    Supervising Provider:   BLAISE ALEENE KIDD [[id number]]     *If you need refills on other medications prior to your next appointment, please contact your  pharmacy*  Follow-Up: Call back or seek an in-person evaluation if the symptoms worsen or if the condition fails to improve as anticipated.  [facility] [phone]  Other Instructions Please hydrate and rest. If you have a humidifier, place it in the bedroom and run at night. I have refilled your albuterol  nebulizer solution. Please take all prescription medications as directed. If you note any non-resolving, new, or worsening symptoms despite treatment, please seek an in-person evaluation ASAP. Do not delay care.   If you have been instructed to have an in-person evaluation today at a local Urgent Care facility, please use the link below. It will take you to a list of all of our available [facility], including address, phone number and hours of operation. Please do not delay care.  [facility]  If you or a family member do not have a primary care provider, use the link below to schedule a visit and establish care. When you choose a [facility] primary care physician or advanced practice provider, you gain a long-term partner in health. Find a Primary Care Provider  Learn more about [facility]'s in-office and virtual care options: [place] - Get Care Now  "

## 2024-03-27 ENCOUNTER — Ambulatory Visit: Payer: Self-pay | Admitting: Urology

## 2024-03-31 ENCOUNTER — Ambulatory Visit
Admission: RE | Admit: 2024-03-31 | Discharge: 2024-03-31 | Disposition: A | Payer: MEDICAID | Source: Ambulatory Visit | Attending: General Practice | Admitting: General Practice

## 2024-03-31 ENCOUNTER — Ambulatory Visit (INDEPENDENT_AMBULATORY_CARE_PROVIDER_SITE_OTHER): Payer: MEDICAID | Admitting: General Practice

## 2024-03-31 ENCOUNTER — Encounter: Payer: Self-pay | Admitting: General Practice

## 2024-03-31 VITALS — BP 120/60 | HR 105 | Temp 98.3°F

## 2024-03-31 DIAGNOSIS — R058 Other specified cough: Secondary | ICD-10-CM

## 2024-03-31 DIAGNOSIS — R053 Chronic cough: Secondary | ICD-10-CM | POA: Insufficient documentation

## 2024-03-31 DIAGNOSIS — F17200 Nicotine dependence, unspecified, uncomplicated: Secondary | ICD-10-CM | POA: Diagnosis not present

## 2024-03-31 MED ORDER — DOXYCYCLINE HYCLATE 100 MG PO TABS: 100.0000 mg | ORAL_TABLET | Freq: Two times a day (BID) | ORAL | 0 refills | Status: AC

## 2024-03-31 MED ORDER — PROMETHAZINE-DM 6.25-15 MG/5ML PO SYRP: 5.0000 mL | ORAL_SOLUTION | Freq: Four times a day (QID) | ORAL | 0 refills | Status: AC | PRN

## 2024-03-31 NOTE — Patient Instructions (Addendum)
 Complete xray(s) prior to leaving today. I will notify you of your results once received.   Start Doxycycline  antibiotic for the infection. Take 1 tablet by mouth twice daily for 7 days.   Start Promethazine -DM 5 ml as needed.   Rest, increase fluid intake and use a humidifier.   Continue neb and inhaler.   It was a pleasure to see you today!

## 2024-03-31 NOTE — Progress Notes (Signed)
 "  Established Patient Office Visit  Subjective   Patient ID: [name], male    DOB: 1970/06/19  Age: 54 y.o. MRN: [id number]  Chief Complaint  Patient presents with   Nasal Congestion    Cough using neb twice a day for over a month every day.     HPI  [name] is a 54 year old male with past medical history of asthma, tobacco abuse, chronic right shoulder pain, seasonal allergies presents today for an acute visit.   Discussed the use of AI scribe software for clinical note transcription with the patient, who gave verbal consent to proceed.  History of Present Illness [name] is a 54 year old male who presents with persistent cough and respiratory symptoms.  He completed two video visits for medical consultations and has been prescribed steroids and cough medicine in the past.  He has been experiencing a persistent cough and head congestion for approximately one month. Initially, he had a sore throat, which has since improved, but he continues to experience postnasal drip. The symptoms fluctuate, with some days being better than others. He uses a nebulizer once to twice daily and has completed a course of prednisone , which he believes caused diarrhea. He also uses cough medicine and albuterol  as needed.  He has a history of smoking and notes that his girlfriend had COVID-19 around Christmas, but he was symptomatic before her. He did not test for COVID-19, assuming it was a cold. No fever, but he mentions possible chills and ringing in the ears without pain or discharge. He experiences sinus pain and occasional sneezing, particularly on the side with a deviated septum.  He feels short of breath and winded, especially at night, and has experienced wheezing during these times. No chest pain, urinary symptoms, dizziness, or significant headaches. He notes that his symptoms are somewhat better today compared to previous days.      Patient Active Problem List   Diagnosis  Date Noted   Persistent cough for 3 weeks or longer 03/31/2024   Recurrent herpes labialis 07/31/2023   Right elbow pain 07/31/2023   Mild intermittent asthma without complication 05/14/2023   Need for shingles vaccine 05/14/2023   Acute pain of right knee 05/01/2023   Low testosterone  in male 05/01/2023   Colon cancer screening 05/01/2023   Smoker 04/03/2023   Chronic right shoulder pain 04/03/2023   Seasonal allergies 04/03/2023   Encounter for screening and preventative care 04/03/2023   Past Medical History:  Diagnosis Date   Abuse, drug or alcohol (HCC)    Anxiety    Asthma    Closed nondisplaced fracture of head of left radius 12/13/2021   Depression    Hx of opioid abuse (HCC) 04/03/2023   Past Surgical History:  Procedure Laterality Date   IR RADIOLOGIST EVAL & MGMT  03/16/2020   Allergies[1]       11/19/2023   12:22 PM 09/06/2023    2:50 PM 07/31/2023   11:12 AM  Depression screen PHQ 2/9  Decreased Interest 0 0 0  Down, Depressed, Hopeless 0 0 0  PHQ - 2 Score 0 0 0  Altered sleeping 0  0  Tired, decreased energy 0  0  Change in appetite 0  0  Feeling bad or failure about yourself  0  0  Trouble concentrating 0  0  Moving slowly or fidgety/restless 0  0  Suicidal thoughts 0  0  PHQ-9 Score 0   0  Difficult doing work/chores Not difficult at all Not difficult at all Not difficult at all     Data saved with a previous flowsheet row definition       11/19/2023   12:22 PM 05/14/2023   10:59 AM 05/01/2023   11:32 AM 04/03/2023   10:48 AM  GAD 7 : Generalized Anxiety Score  Nervous, Anxious, on Edge 0 0 0 0  Control/stop worrying 0 0 0 0  Worry too much - different things 0 0 0 0  Trouble relaxing 0 0 0 0  Restless 0 0 0 2  Easily annoyed or irritable 0 0 0 2  Afraid - awful might happen 0 0 0 0  Total GAD 7 Score 0 0 0 4  Anxiety Difficulty Not difficult at all Not difficult at all Not difficult at all Somewhat difficult      Review of Systems   Constitutional:  Negative for chills and fever.  HENT:  Positive for congestion, sinus pain and tinnitus. Negative for ear discharge, ear pain and sore throat.   Respiratory:  Positive for cough and wheezing. Negative for sputum production and shortness of breath.   Cardiovascular:  Negative for chest pain.  Gastrointestinal:  Negative for abdominal pain, constipation, diarrhea, heartburn, nausea and vomiting.  Genitourinary:  Negative for dysuria, frequency and urgency.  Neurological:  Negative for dizziness and headaches.  Endo/Heme/Allergies:  Negative for polydipsia.  Psychiatric/Behavioral:  Negative for depression and suicidal ideas. The patient is not nervous/anxious.       Objective:     BP 120/60   Pulse (!) 105   Temp 98.3 F (36.8 C) (Oral)   SpO2 95%  BP Readings from Last 3 Encounters:  03/31/24 120/60  03/25/24 131/71  01/06/24 120/60   Wt Readings from Last 3 Encounters:  03/25/24 205 lb (93 kg)  01/06/24 184 lb (83.5 kg)  11/19/23 181 lb (82.1 kg)      Physical Exam Vitals and nursing note reviewed.  Constitutional:      Appearance: He is ill-appearing.  HENT:     Right Ear: Tympanic membrane, ear canal and external ear normal.     Left Ear: Tympanic membrane, ear canal and external ear normal.     Mouth/Throat:     Mouth: Mucous membranes are moist.     Pharynx: Postnasal drip present.  Cardiovascular:     Rate and Rhythm: Normal rate and regular rhythm.     Pulses: Normal pulses.     Heart sounds: Normal heart sounds.  Pulmonary:     Effort: Pulmonary effort is normal. No respiratory distress.     Breath sounds: Decreased breath sounds present. No wheezing.  Neurological:     Mental Status: He is alert and oriented to person, place, and time.  Psychiatric:        Mood and Affect: Mood normal.        Behavior: Behavior normal.        Thought Content: Thought content normal.        Judgment: Judgment normal.      No results found for any  visits on 03/31/24.     The 10-year ASCVD risk score (Arnett DK, et al., 2019) is: 7.2%    Assessment & Plan:  Persistent cough for 3 weeks or longer -     DG Chest 2 View -     Doxycycline  Hyclate; Take 1 tablet (100 mg total) by mouth 2 (two) times daily for 7 days.  Dispense:  14 tablet; Refill: 0  Upper airway cough syndrome -     Promethazine -DM; Take 5 mLs by mouth 4 (four) times daily as needed for cough.  Dispense: 118 mL; Refill: 0    Assessment and Plan Assessment & Plan Persistent cough and upper airway cough syndrome Persistent cough with head congestion, postnasal drip, and sinus pain, likely exacerbated by smoking. Differential includes lingering pneumonia and sinus infection. Concern for walking pneumonia due to smoking history and symptom duration. - Ordered chest x-ray to evaluate for pneumonia. - Start Doxycycline  antibiotic for the infection. Take 1 tablet by mouth twice daily for 10 days.  - Refill provided for Promethazine -DM 5 ml as needed. Discussed sedation precautions. - Continue nebulizer use as needed. - Recommended rest, increased fluid intake, and use of a humidifier during sleep. - Will refer to pulmonologist if symptoms do not improve.  Tobacco use disorder Continued tobacco use despite previous recommendations for cessation. Smoking likely contributing to respiratory symptoms and increased risk for lung cancer. - Encouraged smoking cessation. - Discussed lung cancer screening, though he has been reluctant to proceed.  General health maintenance Discussion of upcoming physical examination and fasting labs. - Scheduled follow-up appointment in two weeks for physical examination and fasting labs.   Return in about 2 weeks (around 04/14/2024) for physical and fasting labs. [name] [name], NP     [1] No Known Allergies  "

## 2024-04-01 ENCOUNTER — Ambulatory Visit: Payer: Self-pay | Admitting: General Practice

## 2024-04-01 DIAGNOSIS — J449 Chronic obstructive pulmonary disease, unspecified: Secondary | ICD-10-CM

## 2024-04-02 ENCOUNTER — Other Ambulatory Visit (HOSPITAL_COMMUNITY): Payer: Self-pay

## 2024-04-06 ENCOUNTER — Encounter: Payer: MEDICAID | Admitting: General Practice

## 2024-04-09 ENCOUNTER — Telehealth: Payer: Self-pay | Admitting: General Practice

## 2024-04-09 DIAGNOSIS — M25561 Pain in right knee: Secondary | ICD-10-CM

## 2024-04-09 NOTE — Telephone Encounter (Signed)
 Received fax from [facility] requesting refill for Meloxican 15 mg.  Last refilled 01/06/2024 for #30 with 3 refills by Dr. Watt.  Meloxicam  not on current medication list.  Refill?

## 2024-04-10 MED ORDER — MELOXICAM 15 MG PO TABS: 15.0000 mg | ORAL_TABLET | Freq: Every day | ORAL | 1 refills | Status: DC

## 2024-04-10 NOTE — Addendum Note (Signed)
 Addended by: WENDELL ARLAND RAMAN on: 04/10/2024 09:09 AM   Modules accepted: Orders

## 2024-04-10 NOTE — Telephone Encounter (Signed)
 Yes, that is OK.  He is end-stage shoulder arthritis.  Can you send in #90 with 1 refill

## 2024-04-12 NOTE — Progress Notes (Unsigned)
 "    Mishal Probert T. Kennie Snedden, MD, CAQ Sports Medicine [facility] [address] [place], [address]  Phone: [phone]  FAX: [phone]  [name] - 54 y.o. male  MRN [id number]  Date of Birth: 1970/11/12  Date: 04/13/2024  PCP: Vincente Shivers, NP  Referral: Vincente Shivers, NP  No chief complaint on file.  Subjective:   [name] is a 54 y.o. very pleasant male patient with There is no height or weight on file to calculate BMI. who presents with the following:  Discussed the use of AI scribe software for clinical note transcription with the patient, who gave verbal consent to proceed.  I remember this patient well.  He has severe right side glenohumeral joint arthritis.  I have done corticosteroid injection in the past which provided him some significant relief.  The last time I saw him, I referred him to Dr. Dozier for further evaluation and consultation about possible definitive management. History of Present Illness     Review of Systems is noted in the HPI, as appropriate  Objective:   There were no vitals taken for this visit.  GEN: No acute distress; alert,appropriate. PULM: Breathing comfortably in no respiratory distress PSYCH: Normally interactive.   Laboratory and Imaging Data:  Assessment and Plan:   No diagnosis found. Assessment & Plan   Medication Management during today's office visit: No orders of the defined types were placed in this encounter.  There are no discontinued medications.  Orders placed today for conditions managed today: No orders of the defined types were placed in this encounter.   Disposition: No follow-ups on file.  [employer] speech-to-text software was used for transcription in this dictation.  Possible transcriptional errors can occur using Animal nutritionist.   Signed,  Jacques DASEN. Ikesha Siller, MD   Outpatient Encounter Medications as of 04/13/2024  Medication Sig    albuterol  (PROVENTIL ) (2.5 MG/3ML) 0.083% nebulizer solution Take 3 mLs (2.5 mg total) by nebulization every 6 (six) hours as needed for wheezing or shortness of breath.   albuterol  (VENTOLIN  HFA) 108 (90 Base) MCG/ACT inhaler INHALE 2 PUFFS INTO THE LUNGS EVERY 6 HOURS AS NEEDED FOR WHEEZING OR SHORTNESS OF BREATH   Amino Acids (AMINO ACID PO) Take by mouth.   benzonatate  (TESSALON ) 100 MG capsule Take 1 capsule (100 mg total) by mouth 3 (three) times daily as needed for cough.   Collagen-Vitamin C-Biotin (COLLAGEN PO) Take by mouth.   CREATINE PO Take by mouth.   finasteride  (PROPECIA ) 1 MG tablet Take 1 tablet (1 mg total) by mouth daily.   fluticasone  (FLONASE ) 50 MCG/ACT nasal spray Place 2 sprays into both nostrils daily.   gabapentin  (NEURONTIN ) 100 MG capsule Take 2 capsules (200 mg total) by mouth 3 (three) times daily.   Insulin  Syringe-Needle U-100 27G X 1/2 0.5 ML MISC Use as directed   Magnesium 80 MG TABS Take by mouth.   Melatonin-Theanine (MELATONIN CALM SLEEP PO) Take by mouth.   meloxicam  (MOBIC ) 15 MG tablet Take 1 tablet (15 mg total) by mouth daily.   minoxidil  (LONITEN ) 2.5 MG tablet Take 0.5 tablets (1.25 mg total) by mouth daily.   predniSONE  (STERAPRED UNI-PAK 21 TAB) 10 MG (21) TBPK tablet Take following package directions   promethazine -dextromethorphan (PROMETHAZINE -DM) 6.25-15 MG/5ML syrup Take 5 mLs by mouth 4 (four) times daily as needed for cough.   PROTEIN PO Take by mouth.   tadalafil  (CIALIS ) 20 MG tablet Take 1 tablet (  20 mg total) by mouth daily as needed.   testosterone  cypionate (DEPOTESTOSTERONE CYPIONATE) 200 MG/ML injection INJECT 0.40MLS SUBCUTANEOUSLY ON MONDAY AND FRIDAYS   UNABLE TO FIND Med Name: BPC [id number]   valACYclovir  (VALTREX ) 500 MG tablet Take 1 tablet (500 mg total) by mouth 2 (two) times daily. For cold sore flare up.   Vit D-Ca Beta Hydrox Beta Meth (HMB PRO PO) Take by mouth.   VITAMIN D , CHOLECALCIFEROL, PO Take by mouth.   No  facility-administered encounter medications on file as of 04/13/2024.   "

## 2024-04-13 ENCOUNTER — Encounter: Payer: Self-pay | Admitting: Family Medicine

## 2024-04-13 ENCOUNTER — Ambulatory Visit: Payer: MEDICAID | Admitting: Family Medicine

## 2024-04-13 VITALS — BP 130/60 | HR 97 | Temp 98.4°F | Ht 69.0 in | Wt 205.0 lb

## 2024-04-13 DIAGNOSIS — M25561 Pain in right knee: Secondary | ICD-10-CM | POA: Diagnosis not present

## 2024-04-13 DIAGNOSIS — M19011 Primary osteoarthritis, right shoulder: Secondary | ICD-10-CM | POA: Diagnosis not present

## 2024-04-13 MED ORDER — GABAPENTIN 300 MG PO CAPS: 300.0000 mg | ORAL_CAPSULE | Freq: Three times a day (TID) | ORAL | 1 refills | Status: AC

## 2024-04-13 MED ORDER — TRIAMCINOLONE ACETONIDE 40 MG/ML IJ SUSP
40.0000 mg | Freq: Once | INTRAMUSCULAR | Status: AC
  Administered 2024-04-13: 40 mg via INTRA_ARTICULAR

## 2024-04-13 MED ORDER — MELOXICAM 15 MG PO TABS: 15.0000 mg | ORAL_TABLET | Freq: Every day | ORAL | 1 refills | Status: AC

## 2024-04-20 ENCOUNTER — Ambulatory Visit: Payer: MEDICAID | Admitting: Pulmonary Disease

## 2024-05-07 ENCOUNTER — Ambulatory Visit: Payer: MEDICAID | Admitting: Dermatology

## 2024-09-23 ENCOUNTER — Ambulatory Visit: Payer: MEDICAID | Admitting: Urology
